# Patient Record
Sex: Female | Born: 1960
Health system: Southern US, Community
[De-identification: ages and names within clinical notes are randomized; demographics above are authoritative.]

## PROBLEM LIST (undated history)

## (undated) DIAGNOSIS — I639 Cerebral infarction, unspecified: Secondary | ICD-10-CM

## (undated) DIAGNOSIS — R4701 Aphasia: Secondary | ICD-10-CM

## (undated) DIAGNOSIS — N189 Chronic kidney disease, unspecified: Secondary | ICD-10-CM

## (undated) DIAGNOSIS — R55 Syncope and collapse: Secondary | ICD-10-CM

## (undated) DIAGNOSIS — J989 Respiratory disorder, unspecified: Secondary | ICD-10-CM

## (undated) DIAGNOSIS — I771 Stricture of artery: Secondary | ICD-10-CM

## (undated) DIAGNOSIS — I779 Disorder of arteries and arterioles, unspecified: Secondary | ICD-10-CM

## (undated) DIAGNOSIS — B004 Herpesviral encephalitis: Secondary | ICD-10-CM

## (undated) DIAGNOSIS — I129 Hypertensive chronic kidney disease with stage 1 through stage 4 chronic kidney disease, or unspecified chronic kidney disease: Secondary | ICD-10-CM

## (undated) DIAGNOSIS — I519 Heart disease, unspecified: Secondary | ICD-10-CM

## (undated) DIAGNOSIS — I2510 Atherosclerotic heart disease of native coronary artery without angina pectoris: Secondary | ICD-10-CM

## (undated) DIAGNOSIS — I82431 Acute embolism and thrombosis of right popliteal vein: Secondary | ICD-10-CM

## (undated) DIAGNOSIS — E785 Hyperlipidemia, unspecified: Secondary | ICD-10-CM

## (undated) DIAGNOSIS — N400 Enlarged prostate without lower urinary tract symptoms: Secondary | ICD-10-CM

## (undated) DIAGNOSIS — R29810 Facial weakness: Secondary | ICD-10-CM

## (undated) DIAGNOSIS — N8110 Cystocele, unspecified: Secondary | ICD-10-CM

## (undated) DIAGNOSIS — E038 Other specified hypothyroidism: Secondary | ICD-10-CM

## (undated) DIAGNOSIS — R7303 Prediabetes: Secondary | ICD-10-CM

## (undated) DIAGNOSIS — E7800 Pure hypercholesterolemia, unspecified: Secondary | ICD-10-CM

## (undated) DIAGNOSIS — R7301 Impaired fasting glucose: Secondary | ICD-10-CM

## (undated) DIAGNOSIS — B0230 Zoster ocular disease, unspecified: Secondary | ICD-10-CM

## (undated) DIAGNOSIS — M6259 Muscle wasting and atrophy, not elsewhere classified, multiple sites: Secondary | ICD-10-CM

## (undated) HISTORY — DX: Atherosclerotic heart disease of native coronary artery without angina pectoris: I25.10

## (undated) HISTORY — DX: Facial weakness: R29.810

## (undated) HISTORY — DX: Heart disease, unspecified: I51.9

## (undated) HISTORY — DX: Disorder of arteries and arterioles, unspecified: I77.9

## (undated) HISTORY — DX: Herpesviral encephalitis: B00.4

## (undated) HISTORY — DX: Syncope and collapse: R55

## (undated) HISTORY — DX: Acute embolism and thrombosis of right popliteal vein: I82.431

## (undated) HISTORY — PX: BREAST BIOPSY: SHX20

## (undated) HISTORY — DX: Hypertensive chronic kidney disease with stage 1 through stage 4 chronic kidney disease, or unspecified chronic kidney disease: I12.9

## (undated) HISTORY — DX: Pure hypercholesterolemia, unspecified: E78.00

## (undated) HISTORY — DX: Muscle wasting and atrophy, not elsewhere classified, multiple sites: M62.59

## (undated) HISTORY — DX: Zoster ocular disease, unspecified: B02.30

## (undated) HISTORY — DX: Cerebral infarction, unspecified: I63.9

## (undated) HISTORY — DX: Other specified hypothyroidism: E03.8

## (undated) HISTORY — DX: Hyperlipidemia, unspecified: E78.5

## (undated) HISTORY — DX: Impaired fasting glucose: R73.01

## (undated) HISTORY — DX: Prediabetes: R73.03

## (undated) HISTORY — DX: Stricture of artery: I77.1

## (undated) HISTORY — DX: Cystocele, unspecified: N81.10

## (undated) HISTORY — DX: Benign prostatic hyperplasia without lower urinary tract symptoms: N40.0

## (undated) HISTORY — DX: Chronic kidney disease, unspecified: N18.9

## (undated) HISTORY — DX: Respiratory disorder, unspecified: J98.9

## (undated) HISTORY — DX: Aphasia: R47.01

---

## 1997-11-26 ENCOUNTER — Ambulatory Visit (HOSPITAL_COMMUNITY): Admission: RE | Admit: 1997-11-26 | Discharge: 1997-11-26 | Payer: Self-pay | Admitting: *Deleted

## 1998-10-20 ENCOUNTER — Ambulatory Visit (HOSPITAL_COMMUNITY): Admission: RE | Admit: 1998-10-20 | Discharge: 1998-10-20 | Payer: Self-pay | Admitting: *Deleted

## 1998-12-11 ENCOUNTER — Other Ambulatory Visit: Admission: RE | Admit: 1998-12-11 | Discharge: 1998-12-11 | Payer: Self-pay | Admitting: Obstetrics and Gynecology

## 1999-09-10 ENCOUNTER — Other Ambulatory Visit: Admission: RE | Admit: 1999-09-10 | Discharge: 1999-09-10 | Payer: Self-pay | Admitting: Obstetrics and Gynecology

## 2000-09-20 ENCOUNTER — Other Ambulatory Visit: Admission: RE | Admit: 2000-09-20 | Discharge: 2000-09-20 | Payer: Self-pay | Admitting: Obstetrics and Gynecology

## 2001-11-27 ENCOUNTER — Other Ambulatory Visit: Admission: RE | Admit: 2001-11-27 | Discharge: 2001-11-27 | Payer: Self-pay | Admitting: Obstetrics and Gynecology

## 2003-01-10 ENCOUNTER — Other Ambulatory Visit: Admission: RE | Admit: 2003-01-10 | Discharge: 2003-01-10 | Payer: Self-pay | Admitting: Obstetrics and Gynecology

## 2004-03-24 ENCOUNTER — Other Ambulatory Visit: Admission: RE | Admit: 2004-03-24 | Discharge: 2004-03-24 | Payer: Self-pay | Admitting: Obstetrics and Gynecology

## 2004-06-01 ENCOUNTER — Ambulatory Visit: Payer: Self-pay | Admitting: Internal Medicine

## 2006-07-22 ENCOUNTER — Ambulatory Visit: Payer: Self-pay | Admitting: Cardiology

## 2008-02-19 ENCOUNTER — Encounter: Admission: RE | Admit: 2008-02-19 | Discharge: 2008-02-19 | Payer: Self-pay | Admitting: Obstetrics and Gynecology

## 2009-03-18 ENCOUNTER — Encounter: Admission: RE | Admit: 2009-03-18 | Discharge: 2009-03-18 | Payer: Self-pay | Admitting: Obstetrics and Gynecology

## 2009-11-11 ENCOUNTER — Encounter: Payer: Self-pay | Admitting: Internal Medicine

## 2009-12-08 ENCOUNTER — Ambulatory Visit: Payer: Self-pay | Admitting: Internal Medicine

## 2009-12-08 LAB — CONVERTED CEMR LAB
HCV Ab: NEGATIVE
Hep B S Ab: NEGATIVE
Hepatitis B Surface Ag: NEGATIVE

## 2009-12-09 LAB — CONVERTED CEMR LAB
ALT: 29 units/L (ref 0–35)
AST: 28 units/L (ref 0–37)
Albumin: 4.5 g/dL (ref 3.5–5.2)
Alkaline Phosphatase: 86 units/L (ref 39–117)
Bilirubin, Direct: 0.1 mg/dL (ref 0.0–0.3)
Total Bilirubin: 1.2 mg/dL (ref 0.3–1.2)
Total Protein: 7.2 g/dL (ref 6.0–8.3)

## 2009-12-15 ENCOUNTER — Telehealth: Payer: Self-pay | Admitting: Internal Medicine

## 2009-12-18 LAB — CONVERTED CEMR LAB: GGT: 83 units/L — ABNORMAL HIGH (ref 7–51)

## 2010-01-15 LAB — HM COLONOSCOPY

## 2010-02-22 ENCOUNTER — Encounter: Payer: Self-pay | Admitting: Cardiology

## 2010-03-03 ENCOUNTER — Other Ambulatory Visit: Payer: Self-pay | Admitting: Internal Medicine

## 2010-03-03 ENCOUNTER — Ambulatory Visit
Admission: RE | Admit: 2010-03-03 | Discharge: 2010-03-03 | Payer: Self-pay | Source: Home / Self Care | Attending: Internal Medicine | Admitting: Internal Medicine

## 2010-03-03 LAB — HEPATIC FUNCTION PANEL
ALT: 20 U/L (ref 0–35)
AST: 24 U/L (ref 0–37)
Albumin: 4.4 g/dL (ref 3.5–5.2)
Alkaline Phosphatase: 84 U/L (ref 39–117)
Bilirubin, Direct: 0.2 mg/dL (ref 0.0–0.3)
Total Bilirubin: 1.1 mg/dL (ref 0.3–1.2)
Total Protein: 7.2 g/dL (ref 6.0–8.3)

## 2010-03-03 LAB — LIPID PANEL
Cholesterol: 238 mg/dL — ABNORMAL HIGH (ref 0–200)
HDL: 65.4 mg/dL (ref >39.00)
Total CHOL/HDL Ratio: 4
Triglycerides: 90 mg/dL (ref 0.0–149.0)
VLDL: 18 mg/dL (ref 0.0–40.0)

## 2010-03-03 LAB — GAMMA GT: GGT: 73 U/L — ABNORMAL HIGH (ref 7–51)

## 2010-03-03 LAB — LDL CHOLESTEROL, DIRECT: Direct LDL: 159.7 mg/dL

## 2010-03-05 NOTE — Assessment & Plan Note (Signed)
Summary: BRAND NEW PT/TO EST/PT REQ CPX/PT COMING IN FASTING/PER DR SW...   Vital Signs:  Patient profile:   50 year old female Height:      64 inches Weight:      140.5 pounds BMI:     24.20 Temp:     98.0 degrees F oral Pulse rate:   64 / minute Pulse rhythm:   regular BP sitting:   116 / 72  (left arm) Cuff size:   regular  Vitals Entered By: Alfred Levins, CMA (December 08, 2009 8:15 AM) CC: establish, fasting   CC:  establish and fasting.  History of Present Illness: CPX  Preventive Screening-Counseling & Management  Alcohol-Tobacco     Smoking Status: never  Caffeine-Diet-Exercise     Does Patient Exercise: yes      Drug Use:  no.    Allergies (verified): No Known Drug Allergies  Past History:  Past Medical History: Unremarkable  Past Surgical History: Lt breast biopsy  Family History: Family History Breast cancer 1st degree relative <50--Mother (living at 58 yo) Family History of CAD Female 1st degree relative <50 CABG (age 79 yo) Family History Diabetes 1st degree relative (father/mother) Family History Hypertension--(father) father deceased--dementia  Social History: Married Never Smoked Alcohol use-yes Drug use-no Regular exercise-yes 3 kids all healthy Smoking Status:  never Drug Use:  no Does Patient Exercise:  yes  Physical Exam  General:  well-developed well-nourished female in no acute distress. HEENT exam atraumatic, normocephalic symmetric her muscles are intact. Neck is supple. Chest clear auscultation cardiac exam S1-S2 are regular. Abdominal exam active bowel sounds, soft nontender extremities without clubbing cyanosis or edema. Neurologic exam she alert and oriented. Gait is normal.   Impression & Recommendations:  Problem # 1:  HEALTH SCREENING (ICD-V70.0) health maintenance up-to-date. She is encouraged to continue excellent health habits. She exercises regularly. She needs colonoscopy. Orders: Gastroenterology Referral  (GI) Specimen Handling ([id number])  Problem # 2:  GAMMA GLUTAMYL TRANSFERASE, SERUM, ELEVATED (ICD-790.5) I suspect this is a spuriousl result on life insurance physical blood work. I'll repeat liver function tests. Will check hepatitis serologies. I doubt any further evaluation will be necessary. Orders: Venipuncture (14782) TLB-Hepatic/Liver Function Pnl (80076-HEPATIC) T-Hepatitis C Anti HCV (95621) T-Hepatitis B Surface Antigen (30865-78469) T-Hepatitis B Surface Antibody ([id number])    Orders Added: 1)  New Patient 40-64 years [99386] 2)  Venipuncture [44010] 3)  TLB-Hepatic/Liver Function Pnl [80076-HEPATIC] 4)  T-Hepatitis C Anti HCV [27253] 5)  T-Hepatitis B Surface Antigen [66440-34742] 6)  T-Hepatitis B Surface Antibody [59563-87564] 7)  Gastroenterology Referral [GI] 8)  Specimen Handling [99000]

## 2010-03-05 NOTE — Progress Notes (Signed)
Summary: labwork results  Phone Note Call from Patient Call back at Home Phone [phone]   Caller: Patient Call For: Birdie Sons MD Summary of Call: pt needs labwork results  Initial call taken by: Heron Sabins,  December 15, 2009 2:38 PM  Follow-up for Phone Call        Call patient. All other lab work is normal. The GGT is still elevated. I don't think anywork up needs to happen at this time. I would like to repeat in 3 months. 3 months labwork: LFTs, GGT Follow-up by: Birdie Sons MD,  December 17, 2009 11:33 AM  Additional Follow-up for Phone Call Additional follow up Details #1::        pt aware Additional Follow-up by: Alfred Levins, CMA,  December 18, 2009 9:23 AM

## 2010-04-09 ENCOUNTER — Telehealth: Payer: Self-pay | Admitting: Internal Medicine

## 2010-04-09 NOTE — Telephone Encounter (Signed)
Pt called and said that nurse was suppose to be mailing a copy of pts last lab results, to home address. Pt called to check on status. Pt said that if this can not be mailed, pt would like to pick up copy. Pls call.

## 2010-04-09 NOTE — Telephone Encounter (Signed)
Put labs in the mail today, pt aware

## 2010-06-16 NOTE — Assessment & Plan Note (Signed)
[facility]                            CARDIOLOGY OFFICE NOTE   EMRY, TOBIN                      MRN:          [id number]  DATE:07/22/2006                            DOB:          [birth year]    PRIMARY CARE PHYSICIAN:  None.   REASON FOR PRESENTATION:  Evaluate patient with chest discomfort,  shortness of breath, and family history of heart disease.   HISTORY OF PRESENT ILLNESS:  The patient is a pleasant 50 year old  without prior cardiac history.  She has had episodes of discomfort with  activity.  She noticed this this spring when she was in [place] skiing.  She had back discomfort and felt particularly short of breath.  She  recently noticed some increased fatigue.  This was earlier this week.  She had some mild chest fullness and back discomfort as well.  She had 1  episode of sharp discomfort this week.  These have been at rest.  She  does do some exercises, but not a lot of cardiovascular.  She has not  noticed any significant symptoms with her exercise regimen.  She has  been able to play tennis and do a little swimming by bringing on the  above symptoms.  The symptoms mentioned happened at rest.  She denies  any neck or arm discomfort.  She has had no new palpitations, though she  occasionally has some at night.  She has had no presyncope or syncope.  She denies any weight gain, lower extremity swelling, cough, fevers, or  chills.   PAST MEDICAL HISTORY:  She has no history of hypertension, diabetes, or  hyperlipidemia.   PAST SURGICAL HISTORY:  Breast biopsy.   ALLERGIES:  SCALLOPS.   MEDICATIONS:  None.   SOCIAL HISTORY:  The patient is an Pensions consultant.  She plays tennis.  She has  3 children.  She is married. She smoked briefly in college.   FAMILY HISTORY:  Remarkable for her brother having 6-vessel bypass this  year at age 76.  Her father probably had early disease, though they did  not know of any vascular problems until  he was in his 2s.  He died at  age 93 of a CVA.   REVIEW OF SYSTEMS:  As stated in the HPI, positive for questionable  reflux, cold intolerance, bleeding in her bowel movements with a  colonoscopy 5 years ago (no findings), negative for other systems.   PHYSICAL EXAMINATION:  The patient is well appearing and in no distress.  She is pleasant.  Blood pressure 118/68, heart rate 65 and regular.  HEENT:  Eyelids unremarkable, pupils are equal, round, and reactive to  light, fundi not visualized.  Oral mucosa unremarkable.  NECK:  No jugular venous distension at 45 degrees, carotid upstroke  brisk and symmetrical.  No bruit.  No thyromegaly.  LYMPHATICS:  No cervical, axillary, inguinal adenopathy.  LUNGS:  Clear to auscultation bilaterally.  BACK:  No costovertebral angle tenderness.  CHEST:  Unremarkable.  HEART:  PMI not displaced or sustained.  S1 and S2 are within normal  limits.  No S3, no  S4.  No clicks, no rubs, no murmurs.  ABDOMEN:  Flat, positive bowel sounds, normal in frequency and pitch.  No bruits, no rebound, no guarding.  No midline pulsatile mass.  No  hepatomegaly, no splenomegaly.  SKIN:  No rashes, no nodules.  EXTREMITIES:  2+ pulses throughout, no edema.  No cyanosis, no clubbing.  NEURO:  Oriented to person, place, and time.  Cranial nerves II-XII  grossly intact, motor grossly intact.   EKG:  Sinus rhythm, rate 65, axis within normal limits, intervals within  normal limits, no acute ST-wave changes.   ASSESSMENT AND PLAN:  1. Chest:  The patient has some chest discomfort, fatigue, and other      episodic symptoms.  She has a very strong family history for heart      disease.  I did review her lipid profile from 2006 and she was      noted to have a LDL of 150 with an HDL of 45.  Given this, there is      some possibility of obstructive coronary disease.  We discussed the      possibilities of stress testing versus imaging with a cardiac CT.      I think  cardiac CT is indicated in this situation.  This will allow      me to quantify any plaque.  This will guide our primary risk      reduction or any need for further intervention.  2. Dyslipidemia:  We will discuss further management in the context of      the CT results.  We did discuss dietary changes today.  3. Followup:  I will see her back as needed based on the results of      the above.  However, I am asking her to get another lipid profile      and we will be reviewing this with her.     Rollene Rotunda, MD, [facility]  Electronically Signed    JH/MedQ  DD: 07/22/2006  DT: 07/22/2006  Job #: [id number]

## 2010-07-09 ENCOUNTER — Telehealth: Payer: Self-pay | Admitting: Internal Medicine

## 2010-07-09 NOTE — Telephone Encounter (Signed)
Pt is needing to know if she has ever had a viral serology for hep a,b,c? Pt needs to know this per gi dr asap.

## 2010-07-13 NOTE — Telephone Encounter (Signed)
Pt p/u copy of labs

## 2010-07-14 ENCOUNTER — Other Ambulatory Visit (INDEPENDENT_AMBULATORY_CARE_PROVIDER_SITE_OTHER): Payer: BC Managed Care – PPO

## 2010-07-14 DIAGNOSIS — K769 Liver disease, unspecified: Secondary | ICD-10-CM

## 2010-07-14 DIAGNOSIS — R945 Abnormal results of liver function studies: Secondary | ICD-10-CM

## 2010-07-15 ENCOUNTER — Encounter: Payer: Self-pay | Admitting: Internal Medicine

## 2010-07-15 LAB — ANA: Anti Nuclear Antibody(ANA): NEGATIVE

## 2010-07-15 LAB — ANTI-SMOOTH MUSCLE ANTIBODY, IGG: Smooth Muscle Ab: 5 U (ref <20)

## 2010-07-16 ENCOUNTER — Telehealth: Payer: Self-pay | Admitting: *Deleted

## 2010-07-16 NOTE — Telephone Encounter (Signed)
Pt wants lab results 

## 2010-07-17 NOTE — Telephone Encounter (Signed)
Pt aware of results 

## 2010-07-21 ENCOUNTER — Ambulatory Visit: Payer: Self-pay | Admitting: Internal Medicine

## 2010-07-23 ENCOUNTER — Ambulatory Visit (INDEPENDENT_AMBULATORY_CARE_PROVIDER_SITE_OTHER): Payer: BC Managed Care – PPO | Admitting: Internal Medicine

## 2010-07-23 ENCOUNTER — Encounter: Payer: Self-pay | Admitting: Internal Medicine

## 2010-07-23 VITALS — BP 102/70 | HR 68 | Temp 98.3°F | Ht 65.0 in | Wt 140.0 lb

## 2010-07-23 DIAGNOSIS — R748 Abnormal levels of other serum enzymes: Secondary | ICD-10-CM

## 2010-07-23 LAB — CBC WITH DIFFERENTIAL/PLATELET
Basophils Absolute: 0 10*3/uL (ref 0.0–0.1)
Basophils Relative: 0.5 % (ref 0.0–3.0)
Eosinophils Absolute: 0.1 10*3/uL (ref 0.0–0.7)
Eosinophils Relative: 1.3 % (ref 0.0–5.0)
HCT: 37.7 % (ref 36.0–46.0)
Hemoglobin: 13 g/dL (ref 12.0–15.0)
Lymphocytes Relative: 38.8 % (ref 12.0–46.0)
Lymphs Abs: 2.2 10*3/uL (ref 0.7–4.0)
MCHC: 34.4 g/dL (ref 30.0–36.0)
MCV: 91.4 fl (ref 78.0–100.0)
Monocytes Absolute: 0.5 10*3/uL (ref 0.1–1.0)
Monocytes Relative: 8.5 % (ref 3.0–12.0)
Neutro Abs: 2.9 10*3/uL (ref 1.4–7.7)
Neutrophils Relative %: 50.9 % (ref 43.0–77.0)
Platelets: 228 10*3/uL (ref 150.0–400.0)
RBC: 4.13 Mil/uL (ref 3.87–5.11)
RDW: 13 % (ref 11.5–14.6)
WBC: 5.8 10*3/uL (ref 4.5–10.5)

## 2010-07-23 LAB — HEPATIC FUNCTION PANEL
ALT: 24 U/L (ref 0–35)
AST: 25 U/L (ref 0–37)
Albumin: 4.7 g/dL (ref 3.5–5.2)
Alkaline Phosphatase: 89 U/L (ref 39–117)
Bilirubin, Direct: 0.1 mg/dL (ref 0.0–0.3)
Total Bilirubin: 1.2 mg/dL (ref 0.3–1.2)
Total Protein: 7.2 g/dL (ref 6.0–8.3)

## 2010-07-23 LAB — GAMMA GT: GGT: 66 U/L — ABNORMAL HIGH (ref 7–51)

## 2010-07-23 NOTE — Progress Notes (Signed)
  Subjective:    Patient ID: Emily Joyce, female    DOB: 1961/01/01, 50 y.o.   MRN: [id number]  HPI  Chronic GGT elevation [facility] - GGT, AST and ALT elevated--- AST in the mid 200 range (her report) No past medical history on file. Past Surgical History  Procedure Date  . Breast biopsy     left    reports that she has never smoked. She does not have any smokeless tobacco history on file. She reports that she drinks alcohol. She reports that she does not use illicit drugs. family history includes Cancer in her mother; Dementia in her father; Diabetes in her father and mother; Heart disease in her father; and Hypertension in her father. No Known Allergies   Review of Systems  patient denies chest pain, shortness of breath, orthopnea. Denies lower extremity edema, abdominal pain, change in appetite, change in bowel movements. Patient denies rashes, musculoskeletal complaints. No other specific complaints in a complete review of systems.      Objective:   Physical Exam  Well-developed well-nourished female in no acute distress. HEENT exam atraumatic, normocephalic, extraocular muscles are intact. Neck is supple. No jugular venous distention no thyromegaly. Chest clear to auscultation without increased work of breathing. Cardiac exam S1 and S2 are regular. Abdominal exam active bowel sounds, soft, nontender. Extremities no edema. Neurologic exam she is alert without any motor sensory deficits. Gait is normal.        Assessment & Plan:

## 2010-07-27 ENCOUNTER — Telehealth: Payer: Self-pay | Admitting: *Deleted

## 2010-07-27 NOTE — Telephone Encounter (Addendum)
Please call with lab results, please.  May leave message.

## 2010-07-27 NOTE — Telephone Encounter (Signed)
Gave pt lab results and numbers, left copy up front for pt to p/u

## 2010-07-28 NOTE — Assessment & Plan Note (Signed)
I have reviewed notes an outside laboratories from [facility] gastroenterology. Note elevated transaminases. I did discuss with her the possibility that this is exercise related. She did exercise vigorously one day prior to most recent laboratory work. I think it's best to repeat the laboratory work and then make a treatment plan for that. His serum transaminases remain elevated the possibility of a liver biopsy may need to be considered.

## 2012-09-26 ENCOUNTER — Encounter: Payer: Self-pay | Admitting: Family Medicine

## 2012-09-26 ENCOUNTER — Ambulatory Visit (INDEPENDENT_AMBULATORY_CARE_PROVIDER_SITE_OTHER): Payer: BC Managed Care – PPO | Admitting: Family Medicine

## 2012-09-26 VITALS — BP 102/68 | Temp 98.3°F | Wt 145.0 lb

## 2012-09-26 DIAGNOSIS — B029 Zoster without complications: Secondary | ICD-10-CM

## 2012-09-26 DIAGNOSIS — R21 Rash and other nonspecific skin eruption: Secondary | ICD-10-CM

## 2012-09-26 MED ORDER — VALACYCLOVIR HCL 1 G PO TABS: 1000.0000 mg | ORAL_TABLET | Freq: Three times a day (TID) | ORAL | Status: DC

## 2012-09-26 NOTE — Progress Notes (Signed)
Chief Complaint  Patient presents with  . painful rash    HPI:  Emily Joyce is a 52 yo F pt of Dr. Cato Mulligan here for an acute visit for a skin rash: -started about 4 days ago on back on L side -burning and painful pain - felt like had pain here before rash developed, had some blisters -denies: fevers, NVD, malaise -has had chicken pox  ROS: See pertinent positives and negatives per HPI.  No past medical history on file.  Past Surgical History  Procedure Laterality Date  . Breast biopsy      left    Family History  Problem Relation Age of Onset  . Cancer Mother     cancer  . Diabetes Mother   . Heart disease Father     CABG  . Diabetes Father   . Hypertension Father   . Dementia Father     History   Social History  . Marital Status: Married    Spouse Name: N/A    Number of Children: N/A  . Years of Education: N/A   Social History Main Topics  . Smoking status: Never Smoker   . Smokeless tobacco: None  . Alcohol Use: Yes  . Drug Use: No  . Sexual Activity:    Other Topics Concern  . None   Social History Narrative  . None    Current outpatient prescriptions:valACYclovir (VALTREX) 1000 MG tablet, Take 1 tablet (1,000 mg total) by mouth 3 (three) times daily., Disp: 21 tablet, Rfl: 0  EXAM:  Filed Vitals:   09/26/12 0856  BP: 102/68  Temp: 98.3 F (36.8 C)    Body mass index is 24.13 kg/(m^2).  GENERAL: vitals reviewed and listed above, alert, oriented, appears well hydrated and in no acute distress  HEENT: atraumatic, conjunttiva clear, no obvious abnormalities on inspection of external nose and ears  NECK: no obvious masses on inspection  MS: moves all extremities without noticeable abnormality  SKIN: two patches of erythematous vesicular lesions on L lower back  PSYCH: pleasant and cooperative, no obvious depression or anxiety  ASSESSMENT AND PLAN:  Discussed the following assessment and plan:  Shingles - Plan: valACYclovir  (VALTREX) 1000 MG tablet  Rash and nonspecific skin eruption  -given discrete and localized area of rash it is difficult to say for certain if this is shingles or an insect bite. Discussed options. Pt worried about shingles and would like to do antiviral medication. Risks discussed. Return precautions discussed. -Patient advised to return or notify a doctor immediately if symptoms worsen or persist or new concerns arise.  Patient Instructions  Shingles Shingles (herpes zoster) is an infection that is caused by the same virus that causes chickenpox (varicella). The infection causes a painful skin rash and fluid-filled blisters, which eventually break open, crust over, and heal. It may occur in any area of the body, but it usually affects only one side of the body or face. The pain of shingles usually lasts about 1 month. However, some people with shingles may develop long-term (chronic) pain in the affected area of the body. Shingles often occurs many years after the person had chickenpox. It is more common:  In people older than 50 years.  In people with weakened immune systems, such as those with HIV, AIDS, or cancer.  In people taking medicines that weaken the immune system, such as transplant medicines.  In people under great stress. CAUSES  Shingles is caused by the varicella zoster virus (VZV), which also  causes chickenpox. After a person is infected with the virus, it can remain in the person's body for years in an inactive state (dormant). To cause shingles, the virus reactivates and breaks out as an infection in a nerve root. The virus can be spread from person to person (contagious) through contact with open blisters of the shingles rash. It will only spread to people who have not had chickenpox. When these people are exposed to the virus, they may develop chickenpox. They will not develop shingles. Once the blisters scab over, the person is no longer contagious and cannot spread the  virus to others. SYMPTOMS  Shingles shows up in stages. The initial symptoms may be pain, itching, and tingling in an area of the skin. This pain is usually described as burning, stabbing, or throbbing.In a few days or weeks, a painful red rash will appear in the area where the pain, itching, and tingling were felt. The rash is usually on one side of the body in a band or belt-like pattern. Then, the rash usually turns into fluid-filled blisters. They will scab over and dry up in approximately 2 3 weeks. Flu-like symptoms may also occur with the initial symptoms, the rash, or the blisters. These may include:  Fever.  Chills.  Headache.  Upset stomach. DIAGNOSIS  Your caregiver will perform a skin exam to diagnose shingles. Skin scrapings or fluid samples may also be taken from the blisters. This sample will be examined under a microscope or sent to a lab for further testing. TREATMENT  There is no specific cure for shingles. Your caregiver will likely prescribe medicines to help you manage the pain, recover faster, and avoid long-term problems. This may include antiviral drugs, anti-inflammatory drugs, and pain medicines. HOME CARE INSTRUCTIONS   Take a cool bath or apply cool compresses to the area of the rash or blisters as directed. This may help with the pain and itching.   Only take over-the-counter or prescription medicines as directed by your caregiver.   Rest as directed by your caregiver.  Keep your rash and blisters clean with mild soap and cool water or as directed by your caregiver.  Do not pick your blisters or scratch your rash. Apply an anti-itch cream or numbing creams to the affected area as directed by your caregiver.  Keep your shingles rash covered with a loose bandage (dressing).  Avoid skin contact with:  Babies.   Pregnant women.   Children with eczema.   Elderly people with transplants.   People with chronic illnesses, such as leukemia or AIDS.    Wear loose-fitting clothing to help ease the pain of material rubbing against the rash.  Keep all follow-up appointments with your caregiver.If the area involved is on your face, you may receive a referral for follow-up to a specialist, such as an eye doctor (ophthalmologist) or an ear, nose, and throat (ENT) doctor. Keeping all follow-up appointments will help you avoid eye complications, chronic pain, or disability.  SEEK IMMEDIATE MEDICAL CARE IF:   You have facial pain, pain around the eye area, or loss of feeling on one side of your face.  You have ear pain or ringing in your ear.  You have loss of taste.  Your pain is not relieved with prescribed medicines.   Your redness or swelling spreads.   You have more pain and swelling.  Your condition is worsening or has changed.   You have a feveror persistent symptoms for more than 2 3 days.  You have a fever and your symptoms suddenly get worse. MAKE SURE YOU:  Understand these instructions.  Will watch your condition.  Will get help right away if you are not doing well or get worse. Document Released: 01/18/2005 Document Revised: 10/13/2011 Document Reviewed: 09/02/2011 [facility] Patient Information 2014 [place], Lona Kettle, Dahlia Client R.

## 2012-09-26 NOTE — Patient Instructions (Signed)
Shingles Shingles (herpes zoster) is an infection that is caused by the same virus that causes chickenpox (varicella). The infection causes a painful skin rash and fluid-filled blisters, which eventually break open, crust over, and heal. It may occur in any area of the body, but it usually affects only one side of the body or face. The pain of shingles usually lasts about 1 month. However, some people with shingles may develop long-term (chronic) pain in the affected area of the body. Shingles often occurs many years after the person had chickenpox. It is more common:  In people older than 50 years.  In people with weakened immune systems, such as those with HIV, AIDS, or cancer.  In people taking medicines that weaken the immune system, such as transplant medicines.  In people under great stress. CAUSES  Shingles is caused by the varicella zoster virus (VZV), which also causes chickenpox. After a person is infected with the virus, it can remain in the person's body for years in an inactive state (dormant). To cause shingles, the virus reactivates and breaks out as an infection in a nerve root. The virus can be spread from person to person (contagious) through contact with open blisters of the shingles rash. It will only spread to people who have not had chickenpox. When these people are exposed to the virus, they may develop chickenpox. They will not develop shingles. Once the blisters scab over, the person is no longer contagious and cannot spread the virus to others. SYMPTOMS  Shingles shows up in stages. The initial symptoms may be pain, itching, and tingling in an area of the skin. This pain is usually described as burning, stabbing, or throbbing.In a few days or weeks, a painful red rash will appear in the area where the pain, itching, and tingling were felt. The rash is usually on one side of the body in a band or belt-like pattern. Then, the rash usually turns into fluid-filled blisters. They  will scab over and dry up in approximately 2 3 weeks. Flu-like symptoms may also occur with the initial symptoms, the rash, or the blisters. These may include:  Fever.  Chills.  Headache.  Upset stomach. DIAGNOSIS  Your caregiver will perform a skin exam to diagnose shingles. Skin scrapings or fluid samples may also be taken from the blisters. This sample will be examined under a microscope or sent to a lab for further testing. TREATMENT  There is no specific cure for shingles. Your caregiver will likely prescribe medicines to help you manage the pain, recover faster, and avoid long-term problems. This may include antiviral drugs, anti-inflammatory drugs, and pain medicines. HOME CARE INSTRUCTIONS   Take a cool bath or apply cool compresses to the area of the rash or blisters as directed. This may help with the pain and itching.   Only take over-the-counter or prescription medicines as directed by your caregiver.   Rest as directed by your caregiver.  Keep your rash and blisters clean with mild soap and cool water or as directed by your caregiver.  Do not pick your blisters or scratch your rash. Apply an anti-itch cream or numbing creams to the affected area as directed by your caregiver.  Keep your shingles rash covered with a loose bandage (dressing).  Avoid skin contact with:  Babies.   Pregnant women.   Children with eczema.   Elderly people with transplants.   People with chronic illnesses, such as leukemia or AIDS.   Wear loose-fitting clothing to help ease   the pain of material rubbing against the rash.  Keep all follow-up appointments with your caregiver.If the area involved is on your face, you may receive a referral for follow-up to a specialist, such as an eye doctor (ophthalmologist) or an ear, nose, and throat (ENT) doctor. Keeping all follow-up appointments will help you avoid eye complications, chronic pain, or disability.  SEEK IMMEDIATE MEDICAL  CARE IF:   You have facial pain, pain around the eye area, or loss of feeling on one side of your face.  You have ear pain or ringing in your ear.  You have loss of taste.  Your pain is not relieved with prescribed medicines.   Your redness or swelling spreads.   You have more pain and swelling.  Your condition is worsening or has changed.   You have a feveror persistent symptoms for more than 2 3 days.  You have a fever and your symptoms suddenly get worse. MAKE SURE YOU:  Understand these instructions.  Will watch your condition.  Will get help right away if you are not doing well or get worse. Document Released: 01/18/2005 Document Revised: 10/13/2011 Document Reviewed: 09/02/2011 [employer] Patient Information 2014 [employer]  

## 2013-02-01 DIAGNOSIS — B029 Zoster without complications: Secondary | ICD-10-CM

## 2013-02-01 HISTORY — DX: Zoster without complications: B02.9

## 2013-06-14 ENCOUNTER — Ambulatory Visit (INDEPENDENT_AMBULATORY_CARE_PROVIDER_SITE_OTHER): Payer: BC Managed Care – PPO | Admitting: Family Medicine

## 2013-06-14 ENCOUNTER — Encounter: Payer: Self-pay | Admitting: Family Medicine

## 2013-06-14 VITALS — BP 100/64 | HR 86 | Temp 97.9°F | Ht 65.0 in | Wt 147.0 lb

## 2013-06-14 DIAGNOSIS — W57XXXA Bitten or stung by nonvenomous insect and other nonvenomous arthropods, initial encounter: Secondary | ICD-10-CM

## 2013-06-14 DIAGNOSIS — T148 Other injury of unspecified body region: Secondary | ICD-10-CM

## 2013-06-14 DIAGNOSIS — Z23 Encounter for immunization: Secondary | ICD-10-CM

## 2013-06-14 MED ORDER — TETANUS-DIPHTH-ACELL PERTUSSIS 5-2.5-18.5 LF-MCG/0.5 IM SUSP: 0.5000 mL | Freq: Once | INTRAMUSCULAR | Status: DC

## 2013-06-14 MED ORDER — DOXYCYCLINE HYCLATE 100 MG PO TABS: 100.0000 mg | ORAL_TABLET | Freq: Two times a day (BID) | ORAL | Status: DC

## 2013-06-14 NOTE — Patient Instructions (Signed)
Tick Bite Information Ticks are insects that attach themselves to the skin and draw blood for food. There are various types of ticks. Common types include wood ticks and deer ticks. Most ticks live in shrubs and grassy areas. Ticks can climb onto your body when you make contact with leaves or grass where the tick is waiting. The most common places on the body for ticks to attach themselves are the scalp, neck, armpits, waist, and groin. Most tick bites are harmless, but sometimes ticks carry germs that cause diseases. These germs can be spread to a person during the tick's feeding process. The chance of a disease spreading through a tick bite depends on:   The type of tick.  Time of year.   How long the tick is attached.   Geographic location.  HOW CAN YOU PREVENT TICK BITES? Take these steps to help prevent tick bites when you are outdoors:  Wear protective clothing. Long sleeves and long pants are best.   Wear white clothes so you can see ticks more easily.  Tuck your pant legs into your socks.   If walking on a trail, stay in the middle of the trail to avoid brushing against bushes.  Avoid walking through areas with long grass.  Put insect repellent on all exposed skin and along boot tops, pant legs, and sleeve cuffs.   Check clothing, hair, and skin repeatedly and before going inside.   Brush off any ticks that are not attached.  Take a shower or bath as soon as possible after being outdoors.  WHAT IS THE PROPER WAY TO REMOVE A TICK? Ticks should be removed as soon as possible to help prevent diseases caused by tick bites. 1. If latex gloves are available, put them on before trying to remove a tick.  2. Using fine-point tweezers, grasp the tick as close to the skin as possible. You may also use curved forceps or a tick removal tool. Grasp the tick as close to its head as possible. Avoid grasping the tick on its body. 3. Pull gently with steady upward pressure until  the tick lets go. Do not twist the tick or jerk it suddenly. This may break off the tick's head or mouth parts. 4. Do not squeeze or crush the tick's body. This could force disease-carrying fluids from the tick into your body.  5. After the tick is removed, wash the bite area and your hands with soap and water or other disinfectant such as alcohol. 6. Apply a small amount of antiseptic cream or ointment to the bite site.  7. Wash and disinfect any instruments that were used.  Do not try to remove a tick by applying a hot match, petroleum jelly, or fingernail polish to the tick. These methods do not work and may increase the chances of disease being spread from the tick bite.  WHEN SHOULD YOU SEEK MEDICAL CARE? Contact your health care provider if you are unable to remove a tick from your skin or if a part of the tick breaks off and is stuck in the skin.  After a tick bite, you need to be aware of signs and symptoms that could be related to diseases spread by ticks. Contact your health care provider if you develop any of the following in the days or weeks after the tick bite:  Unexplained fever.  Rash. A circular rash that appears days or weeks after the tick bite may indicate the possibility of Lyme disease. The rash may resemble   a target with a bull's-eye and may occur at a different part of your body than the tick bite.  Redness and swelling in the area of the tick bite.   Tender, swollen lymph glands.   Diarrhea.   Weight loss.   Cough.   Fatigue.   Muscle, joint, or bone pain.   Abdominal pain.   Headache.   Lethargy or a change in your level of consciousness.  Difficulty walking or moving your legs.   Numbness in the legs.   Paralysis.  Shortness of breath.   Confusion.   Repeated vomiting.  Document Released: 01/16/2000 Document Revised: 11/08/2012 Document Reviewed: 06/28/2012 [employer] Patient Information 2014 [employer].  

## 2013-06-14 NOTE — Progress Notes (Signed)
No chief complaint on file.   HPI:  Tick Bite: -reports: bit by tick about 1 week ago and doesn't think was biting long and removed, stari tick per distruction -denies: fever, malaise, rash, vomiting, HA -can't remember last tdap  ROS: See pertinent positives and negatives per HPI.  No past medical history on file.  Past Surgical History  Procedure Laterality Date  . Breast biopsy      left    Family History  Problem Relation Age of Onset  . Cancer Mother     cancer  . Diabetes Mother   . Heart disease Father     CABG  . Diabetes Father   . Hypertension Father   . Dementia Father     History   Social History  . Marital Status: Married    Spouse Name: N/A    Number of Children: N/A  . Years of Education: N/A   Social History Main Topics  . Smoking status: Never Smoker   . Smokeless tobacco: None  . Alcohol Use: Yes  . Drug Use: No  . Sexual Activity:    Other Topics Concern  . None   Social History Narrative  . None    Current outpatient prescriptions:doxycycline (VIBRA-TABS) 100 MG tablet, Take 1 tablet (100 mg total) by mouth 2 (two) times daily., Disp: 20 tablet, Rfl: 0  EXAM:  Filed Vitals:   06/14/13 1408  BP: 100/64  Pulse: 86  Temp: 97.9 F (36.6 C)    Body mass index is 24.46 kg/(m^2).  GENERAL: vitals reviewed and listed above, alert, oriented, appears well hydrated and in no acute distress  HEENT: atraumatic, conjunttiva clear, no obvious abnormalities on inspection of external nose and ears  NECK: no obvious masses on inspection  LUNGS: clear to auscultation bilaterally, no wheezes, rales or rhonchi, good air movement  SKIN: ? Target lesion versus mild cellulitis upper back  PSYCH: pleasant and cooperative, no obvious depression or anxiety  ASSESSMENT AND PLAN:  Discussed the following assessment and plan:  Tick bite - Plan: doxycycline (VIBRA-TABS) 100 MG tablet  -tx with doxy to cover tick borne illness/mild cellulitis  though most likely STARI and no systemic symptoms -tdap booster -return and emergent precautions, discussion of signs and symptoms of tick borne illnes -Patient advised to return or notify a doctor immediately if symptoms worsen or persist or new concerns arise.  Patient Instructions  Tick Bite Information Ticks are insects that attach themselves to the skin and draw blood for food. There are various types of ticks. Common types include wood ticks and deer ticks. Most ticks live in shrubs and grassy areas. Ticks can climb onto your body when you make contact with leaves or grass where the tick is waiting. The most common places on the body for ticks to attach themselves are the scalp, neck, armpits, waist, and groin. Most tick bites are harmless, but sometimes ticks carry germs that cause diseases. These germs can be spread to a person during the tick's feeding process. The chance of a disease spreading through a tick bite depends on:   The type of tick.  Time of year.   How long the tick is attached.   Geographic location.  HOW CAN YOU PREVENT TICK BITES? Take these steps to help prevent tick bites when you are outdoors:  Wear protective clothing. Long sleeves and long pants are best.   Wear white clothes so you can see ticks more easily.  Tuck your pant legs into your  socks.   If walking on a trail, stay in the middle of the trail to avoid brushing against bushes.  Avoid walking through areas with long grass.  Put insect repellent on all exposed skin and along boot tops, pant legs, and sleeve cuffs.   Check clothing, hair, and skin repeatedly and before going inside.   Brush off any ticks that are not attached.  Take a shower or bath as soon as possible after being outdoors.  WHAT IS THE PROPER WAY TO REMOVE A TICK? Ticks should be removed as soon as possible to help prevent diseases caused by tick bites. 1. If latex gloves are available, put them on before trying  to remove a tick.  2. Using fine-point tweezers, grasp the tick as close to the skin as possible. You may also use curved forceps or a tick removal tool. Grasp the tick as close to its head as possible. Avoid grasping the tick on its body. 3. Pull gently with steady upward pressure until the tick lets go. Do not twist the tick or jerk it suddenly. This may break off the tick's head or mouth parts. 4. Do not squeeze or crush the tick's body. This could force disease-carrying fluids from the tick into your body.  5. After the tick is removed, wash the bite area and your hands with soap and water or other disinfectant such as alcohol. 6. Apply a small amount of antiseptic cream or ointment to the bite site.  7. Wash and disinfect any instruments that were used.  Do not try to remove a tick by applying a hot match, petroleum jelly, or fingernail polish to the tick. These methods do not work and may increase the chances of disease being spread from the tick bite.  WHEN SHOULD YOU SEEK MEDICAL CARE? Contact your health care provider if you are unable to remove a tick from your skin or if a part of the tick breaks off and is stuck in the skin.  After a tick bite, you need to be aware of signs and symptoms that could be related to diseases spread by ticks. Contact your health care provider if you develop any of the following in the days or weeks after the tick bite:  Unexplained fever.  Rash. A circular rash that appears days or weeks after the tick bite may indicate the possibility of Lyme disease. The rash may resemble a target with a bull's-eye and may occur at a different part of your body than the tick bite.  Redness and swelling in the area of the tick bite.   Tender, swollen lymph glands.   Diarrhea.   Weight loss.   Cough.   Fatigue.   Muscle, joint, or bone pain.   Abdominal pain.   Headache.   Lethargy or a change in your level of consciousness.  Difficulty walking  or moving your legs.   Numbness in the legs.   Paralysis.  Shortness of breath.   Confusion.   Repeated vomiting.  Document Released: 01/16/2000 Document Revised: 11/08/2012 Document Reviewed: 06/28/2012 [facility] Patient Information 2014 [employer], [place].      Terressa KoyanagiHannah R. Kim

## 2013-06-14 NOTE — Progress Notes (Signed)
Pre visit review using our clinic review tool, if applicable. No additional management support is needed unless otherwise documented below in the visit note. 

## 2013-06-14 NOTE — Addendum Note (Signed)
Addended by: Johnella MoloneyFUNDERBURK, Cara Aguino A on: 06/14/2013 02:45 PM   Modules accepted: Orders

## 2013-07-31 ENCOUNTER — Ambulatory Visit (INDEPENDENT_AMBULATORY_CARE_PROVIDER_SITE_OTHER): Payer: BC Managed Care – PPO | Admitting: Family Medicine

## 2013-07-31 ENCOUNTER — Encounter: Payer: Self-pay | Admitting: Family Medicine

## 2013-07-31 VITALS — BP 104/70 | Temp 97.9°F | Wt 145.8 lb

## 2013-07-31 DIAGNOSIS — J029 Acute pharyngitis, unspecified: Secondary | ICD-10-CM

## 2013-07-31 NOTE — Progress Notes (Signed)
Pre visit review using our clinic review tool, if applicable. No additional management support is needed unless otherwise documented below in the visit note. 

## 2013-07-31 NOTE — Progress Notes (Signed)
   Subjective:    Patient ID: Emily Joyce, female    DOB: 1960-05-19, 53 y.o.   MRN: [id number]  HPI Alexia Freestoneatty is a 53 year old married female nonsmoker who comes in with a 2 week history of a right-sided sore throat  States she's had a sore throat for the past 2 weeks it seems to be getting a little better but not gone away. It's only on the right side. She has no fever earache sore throat or cough. She's not been on any sick children.   Review of Systems Review of systems otherwise negative    Objective:   Physical Exam Well-developed well-nourished female no acute distress vital signs stable she is afebrile...........Marland Kitchen. examination of the oral cavity teeth and gums, all normal. Left tonsil normal. Right tonsil is cryptic with pockets and food particle stuck in the crypts       Assessment & Plan:  Pharyngitis secondary to cryptic tonsils..........Marland Kitchen. gargle 3-4 times daily

## 2013-07-31 NOTE — Patient Instructions (Signed)
Gargle with warm salt water 3-4 times daily return when necessary

## 2013-08-29 ENCOUNTER — Other Ambulatory Visit: Payer: Self-pay | Admitting: Obstetrics and Gynecology

## 2013-08-31 LAB — CYTOLOGY - PAP

## 2013-10-09 ENCOUNTER — Telehealth: Payer: Self-pay | Admitting: Internal Medicine

## 2013-10-09 NOTE — Telephone Encounter (Signed)
Per Dr Selena Batten she will see the pt.

## 2013-10-09 NOTE — Telephone Encounter (Signed)
Pt is a prev [facility] patient and would like to know if Dr Selena Batten would except her as a patient.

## 2013-10-10 NOTE — Telephone Encounter (Signed)
Pt has been notified of decision

## 2013-10-11 ENCOUNTER — Ambulatory Visit: Payer: BC Managed Care – PPO | Admitting: Family Medicine

## 2013-10-23 ENCOUNTER — Encounter: Payer: Self-pay | Admitting: Family Medicine

## 2013-10-23 ENCOUNTER — Ambulatory Visit (INDEPENDENT_AMBULATORY_CARE_PROVIDER_SITE_OTHER): Payer: BC Managed Care – PPO | Admitting: Family Medicine

## 2013-10-23 VITALS — BP 100/78 | HR 63 | Temp 97.8°F | Ht 65.0 in | Wt 145.5 lb

## 2013-10-23 DIAGNOSIS — E785 Hyperlipidemia, unspecified: Secondary | ICD-10-CM

## 2013-10-23 DIAGNOSIS — R946 Abnormal results of thyroid function studies: Secondary | ICD-10-CM

## 2013-10-23 DIAGNOSIS — Z7689 Persons encountering health services in other specified circumstances: Secondary | ICD-10-CM

## 2013-10-23 LAB — T4, FREE: Free T4: 0.71 ng/dL (ref 0.60–1.60)

## 2013-10-23 LAB — TSH: TSH: 0.74 u[IU]/mL (ref 0.35–4.50)

## 2013-10-23 NOTE — Progress Notes (Signed)
No chief complaint on file.   HPI:  Emily Joyce is here to establish care.  Last PCP and physical: Sees Dr. Dareen Piano at [facility] for annual exams - had all of her labs in July. LDL cholesterol was elevated and the free t4 mildly low but TSH was normal.  Has the following chronic problems and concerns today:  Patient Active Problem List   Diagnosis Date Noted  . Borderline abnormal TFTs 10/23/2013  . Hyperlipemia 10/23/2013   Abnormal TFTs: -on screening with gyn -denies: heat/cold intolerance, skin changes, palpitations  HLD: -with labs done by gyn -diet is ok, no regular exercise -never on medication  L leg pain:  -a few weeks ago with certain positions, a little mild numbness that resolved -now resolved  ROS negative for unless reported above: fevers, unintentional weight loss, hearing or vision loss, chest pain, palpitations, struggling to breath, hemoptysis, melena, hematochezia, hematuria, falls, loc, si, thoughts of self harm  Past Medical History  Diagnosis Date  . Hyperlipidemia     Family History  Problem Relation Age of Onset  . Cancer Mother     cancer  . Diabetes Mother   . Heart disease Father     CABG  . Diabetes Father   . Hypertension Father   . Dementia Father     History   Social History  . Marital Status: Married    Spouse Name: N/A    Number of Children: N/A  . Years of Education: N/A   Social History Main Topics  . Smoking status: Never Smoker   . Smokeless tobacco: None  . Alcohol Use: Yes  . Drug Use: No  . Sexual Activity:    Other Topics Concern  . 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  EXAM:  Filed Vitals:   10/23/13 1442  BP: 100/78  Pulse: 63  Temp: 97.8 F (36.6 C)    Body mass  index is 24.21 kg/(m^2).  GENERAL: vitals reviewed and listed above, alert, oriented, appears well hydrated and in no acute distress  HEENT: atraumatic, conjunttiva clear, no obvious abnormalities on inspection of external nose and ears  NECK: no obvious masses on inspection  LUNGS: clear to auscultation bilaterally, no wheezes, rales or rhonchi, good air movement  CV: HRRR, no peripheral edema  MS: moves all extremities without noticeable abnormality  PSYCH: pleasant and cooperative, no obvious depression or anxiety  ASSESSMENT AND PLAN:  Discussed the following assessment and plan:  Encounter to establish care  Borderline abnormal TFTs - Plan: TSH, T4, Free, T3 -repeat testing  Hyperlipemia -opted for diet and exercise -recheck in 4 months  -We reviewed the PMH, PSH, FH, SH, Meds and Allergies. -We provided refills for any medications we will prescribe as needed. -We addressed current concerns per orders and patient instructions. -We have asked for records for pertinent exams, studies, vaccines and notes from previous providers. -We have advised patient to follow up per instructions below.   -Patient advised to return or notify a doctor immediately if symptoms worsen or persist or new concerns arise.  Patient Instructions  -We have ordered labs or studies at this visit. It can take up to  1-2 weeks for results and processing. We will contact you with instructions IF your results are abnormal. Normal results will be released to your [facility]. If you have not heard from [place] or can not find your results in [facility] in 2 weeks please contact our office.  We recommend the following healthy lifestyle measures: - eat a healthy diet consisting of lots of vegetables, fruits, beans, nuts, seeds, healthy meats such as white chicken and fish and whole grains.  - avoid fried foods, fast food, processed foods, sodas, red meet and other fattening foods.  - get a least 150 minutes of  aerobic exercise per week.            Kriste Basque R.

## 2013-10-23 NOTE — Progress Notes (Signed)
Pre visit review using our clinic review tool, if applicable. No additional management support is needed unless otherwise documented below in the visit note. 

## 2013-10-23 NOTE — Patient Instructions (Signed)
-  We have ordered labs or studies at this visit. It can take up to 1-2 weeks for results and processing. We will contact you with instructions IF your results are abnormal. Normal results will be released to your MYCHART. If you have not heard from us or can not find your results in MYCHART in 2 weeks please contact our office.  We recommend the following healthy lifestyle measures: - eat a healthy diet consisting of lots of vegetables, fruits, beans, nuts, seeds, healthy meats such as white chicken and fish and whole grains.  - avoid fried foods, fast food, processed foods, sodas, red meet and other fattening foods.  - get a least 150 minutes of aerobic exercise per week.        

## 2013-10-24 ENCOUNTER — Encounter: Payer: Self-pay | Admitting: *Deleted

## 2013-10-24 LAB — T3: T3, Total: 96.8 ng/dL (ref 80.0–204.0)

## 2014-09-02 LAB — HM PAP SMEAR: HM Pap smear: NORMAL

## 2014-09-04 ENCOUNTER — Encounter: Payer: Self-pay | Admitting: Family Medicine

## 2015-04-11 ENCOUNTER — Encounter: Payer: Self-pay | Admitting: Family Medicine

## 2015-04-11 ENCOUNTER — Ambulatory Visit (INDEPENDENT_AMBULATORY_CARE_PROVIDER_SITE_OTHER)
Admission: RE | Admit: 2015-04-11 | Discharge: 2015-04-11 | Disposition: A | Discharge status: Home / Self Care | Payer: BLUE CROSS/BLUE SHIELD | Source: Ambulatory Visit | Attending: Family Medicine | Admitting: Family Medicine

## 2015-04-11 ENCOUNTER — Ambulatory Visit (INDEPENDENT_AMBULATORY_CARE_PROVIDER_SITE_OTHER): Payer: BLUE CROSS/BLUE SHIELD | Admitting: Family Medicine

## 2015-04-11 VITALS — BP 90/58 | HR 71 | Temp 98.3°F | Ht 65.0 in | Wt 149.8 lb

## 2015-04-11 DIAGNOSIS — R0789 Other chest pain: Secondary | ICD-10-CM

## 2015-04-11 DIAGNOSIS — W57XXXA Bitten or stung by nonvenomous insect and other nonvenomous arthropods, initial encounter: Secondary | ICD-10-CM | POA: Diagnosis not present

## 2015-04-11 DIAGNOSIS — T148 Other injury of unspecified body region: Secondary | ICD-10-CM

## 2015-04-11 NOTE — Progress Notes (Signed)
Pre visit review using our clinic review tool, if applicable. No additional management support is needed unless otherwise documented below in the visit note. 
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
## 2015-04-11 NOTE — Patient Instructions (Signed)
BEFORE YOU LEAVE: -xray sheet -schedule physical in 1-2 months  Go get xray.  Heat 15 minutes twice daily.  Tylenol 500-1000mg  up to 3 times daily for pain as needed.

## 2015-04-14 ENCOUNTER — Encounter: Payer: Self-pay | Admitting: Family Medicine

## 2015-04-14 ENCOUNTER — Ambulatory Visit (INDEPENDENT_AMBULATORY_CARE_PROVIDER_SITE_OTHER): Payer: BLUE CROSS/BLUE SHIELD | Admitting: Family Medicine

## 2015-04-14 VITALS — BP 92/62 | HR 74 | Temp 97.8°F | Ht 64.0 in | Wt 147.0 lb

## 2015-04-14 DIAGNOSIS — Z0000 Encounter for general adult medical examination without abnormal findings: Secondary | ICD-10-CM | POA: Diagnosis not present

## 2015-04-14 LAB — LIPID PANEL
Cholesterol: 225 mg/dL — ABNORMAL HIGH (ref 0–200)
HDL: 67.7 mg/dL (ref >39.00)
LDL Cholesterol: 131 mg/dL — ABNORMAL HIGH (ref 0–99)
NonHDL: 157.22
Total CHOL/HDL Ratio: 3
Triglycerides: 129 mg/dL (ref 0.0–149.0)
VLDL: 25.8 mg/dL (ref 0.0–40.0)

## 2015-04-14 LAB — HEMOGLOBIN A1C: Hgb A1c MFr Bld: 5.7 % (ref 4.6–6.5)

## 2015-04-14 NOTE — Progress Notes (Signed)
HPI:  Here for CPE:  -Concerns and/or follow up today: none. Chest wall pain is resolving.   -Diet: variety of foods, balance and well rounded  -Exercise: no regular exercise  -Taking folic acid, vitamin D or calcium: no  -Diabetes and Dyslipidemia Screening:FASTING for labs  -Hx of HTN: no  -Vaccines: UTD  -pap history: 08/2013 [facility]  -FDLMP:n/a  -sexual activity: yes, female partner, no new partners  -wants STI testing (Hep C if born [birth year]): no  -FH breast, colon or ovarian ca: see FH Last mammogram: 09/2013 with [facility] Last colon cancer screening: 2012 and normal per her report - done in [place]  Breast Ca Risk Assessment: -does breast health with her gynecologist  -Alcohol, Tobacco, drug use: see social history  Review of Systems - no fevers, unintentional weight loss, vision loss, hearing loss, chest pain, sob, hemoptysis, melena, hematochezia, hematuria, genital discharge, changing or concerning skin lesions, bleeding, bruising, loc, thoughts of self harm or SI  Past Medical History  Diagnosis Date  . Hyperlipidemia     Past Surgical History  Procedure Laterality Date  . Breast biopsy      left    Family History  Problem Relation Age of Onset  . Cancer Mother     cancer  . Diabetes Mother   . Heart disease Father     CABG  . Diabetes Father   . Hypertension Father   . Dementia Father     Social History   Social History  . Marital Status: Married    Spouse Name: N/A  . Number of Children: N/A  . Years of Education: N/A   Social History Main Topics  . Smoking status: Never Smoker   . Smokeless tobacco: None  . Alcohol Use: Yes  . Drug Use: No  . Sexual Activity: Not Asked   Other Topics Concern  . 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  EXAM:  Filed Vitals:   04/14/15 0843  BP: 92/62  Pulse: 74  Temp: 97.8 F (36.6 C)    GENERAL: vitals reviewed and listed below, alert, oriented, appears well hydrated and in no acute distress  HEENT: head atraumatic, PERRLA, normal appearance of eyes, ears, nose and mouth. moist mucus membranes.  NECK: supple, no masses or lymphadenopathy  LUNGS: clear to auscultation bilaterally, no rales, rhonchi or wheeze  CV: HRRR, no peripheral edema or cyanosis, normal pedal pulses  BREAST: declined  ABDOMEN: bowel sounds normal, soft, non tender to palpation, no masses, no rebound or guarding  ZO:[id number]  SKIN: no rash or abnormal lesions, few nevi on back, none suspicious in appearance  MS: normal gait, moves all extremities normally  NEURO: CN II-XII grossly intact, normal muscle strength and sensation to light touch on extremities  PSYCH: normal affect, pleasant and cooperative  ASSESSMENT AND PLAN:  Discussed the following assessment and plan:  Visit for preventive health examination - Plan: Lipid Panel, Hemoglobin A1c  -Discussed and advised all [place] preventive services health task force level A and B recommendations for age, sex and risks.  -Advised at least 150 minutes of exercise per week and a healthy diet low  in saturated fats and sweets and consisting of fresh fruits and vegetables, lean meats such as fish and white chicken and whole grains.  -requested pt provide us with mammo and colon reports  -advised good sun protection  -FASTING labs, studies and vaccines per orders this encounter  Orders Placed This Encounter  Procedures  . Lipid Panel  . Hemoglobin A1c    Patient advised to return to clinic immediately if symptoms worsen or persist or new concerns.  Patient Instructions  BEFORE YOU LEAVE: -labs -follow up in 1 year for physical  Get mammogram yearly with your  gynecologist  Vit D3 [phone] IU daily  Please provide us with a copy of your colonoscopy report  -We have ordered labs or studies at this visit. It can take up to 1-2 weeks for results and processing. We will contact you with instructions IF your results are abnormal. Normal results will be released to [facility]. If you have not heard from us or can not find your results in [facility] in 2 weeks please contact our office.  We recommend the following healthy lifestyle measures: - eat a healthy whole foods diet consisting of regular small meals composed of vegetables, fruits, beans, nuts, seeds, healthy meats such as white chicken and fish and whole grains.  - avoid sweets, white starchy foods, fried foods, fast food, processed foods, sodas, red meet and other fattening foods.  - get a least 150-300 minutes of aerobic exercise per week.           No Follow-up on file.  Kriste BasqueKIM, Finlee Concepcion R.

## 2015-04-14 NOTE — Progress Notes (Signed)
Pre visit review using our clinic review tool, if applicable. No additional management support is needed unless otherwise documented below in the visit note. 

## 2015-04-14 NOTE — Patient Instructions (Addendum)
BEFORE YOU LEAVE: -labs -follow up in 1 year for physical  Get mammogram yearly with your gynecologist  Vit D3 [phone] IU daily  Please provide us with a copy of your colonoscopy report  -We have ordered labs or studies at this visit. It can take up to 1-2 weeks for results and processing. We will contact you with instructions IF your results are abnormal. Normal results will be [facility] to your [facility]. If you have not heard from us or can not find your results in [facility] in 2 weeks please contact our office.  We recommend the following healthy lifestyle measures: - eat a healthy whole foods diet consisting of regular small meals composed of vegetables, fruits, beans, nuts, seeds, healthy meats such as white chicken and fish and whole grains.  - avoid sweets, white starchy foods, fried foods, fast food, processed foods, sodas, red meet and other fattening foods.  - get a least 150-300 minutes of aerobic exercise per week.

## 2015-08-26 ENCOUNTER — Ambulatory Visit (INDEPENDENT_AMBULATORY_CARE_PROVIDER_SITE_OTHER): Payer: BLUE CROSS/BLUE SHIELD | Admitting: Family Medicine

## 2015-08-26 ENCOUNTER — Encounter: Payer: Self-pay | Admitting: Family Medicine

## 2015-08-26 VITALS — BP 94/60 | HR 77 | Temp 98.1°F | Ht 64.0 in | Wt 147.7 lb

## 2015-08-26 DIAGNOSIS — J069 Acute upper respiratory infection, unspecified: Secondary | ICD-10-CM | POA: Diagnosis not present

## 2015-08-26 NOTE — Progress Notes (Signed)
Pre visit review using our clinic review tool, if applicable. No additional management support is needed unless otherwise documented below in the visit note. 

## 2015-08-26 NOTE — Progress Notes (Signed)
HPI:  Sore throat: -started: yesterday -symptoms:nasal congestion, sore throat, pnd -denies:fever, SOB, NVD, tooth pain -has tried: nothing -sick contacts/travel/risks: no reported flu, strep or tick exposure  ROS: See pertinent positives and negatives per HPI.  Past Medical History:  Diagnosis Date  . Hyperlipidemia     Past Surgical History:  Procedure Laterality Date  . BREAST BIOPSY     left    Family History  Problem Relation Age of Onset  . Cancer Mother     cancer  . Diabetes Mother   . Heart disease Father     CABG  . Diabetes Father   . Hypertension Father   . Dementia Father     Social History   Social History  . Marital status: Married    Spouse name: N/A  . Number of children: N/A  . Years of education: N/A   Social History Main Topics  . Smoking status: Never Smoker  . Smokeless tobacco: None  . Alcohol use Yes  . Drug use: No  . Sexual activity: Not Asked   Other Topics Concern  . 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  EXAM:  Vitals:   08/26/15 1403  BP: 94/60  Pulse: 77  Temp: 98.1 F (36.7 C)    Body mass index is 25.35 kg/m.  GENERAL: vitals reviewed and listed above, alert, oriented, appears well hydrated and in no acute distress  HEENT: atraumatic, conjunttiva clear, no obvious abnormalities on inspection of external nose and ears, normal appearance of ear canals and TMs, clear nasal congestion, mild post oropharyngeal erythema with PND, no tonsillar edema or exudate, no sinus TTP  NECK: no obvious masses on inspection  LUNGS: clear to auscultation bilaterally, no wheezes, rales or rhonchi, good air movement  CV: HRRR, no peripheral edema  MS: moves all extremities without noticeable  abnormality  PSYCH: pleasant and cooperative, no obvious depression or anxiety  ASSESSMENT AND PLAN:  Discussed the following assessment and plan:  Viral upper respiratory illness  -given HPI and exam findings today, a serious infection or illness is unlikely. We discussed potential etiologies, with VURI being most likely, and advised supportive care and monitoring. We discussed treatment side effects, likely course, antibiotic misuse, transmission, and signs of developing a serious illness. -of course, we advised to return or notify a doctor immediately if symptoms worsen or persist or new concerns arise.    Patient Instructions  INSTRUCTIONS FOR UPPER RESPIRATORY INFECTION:  -plenty of rest and fluids  -nasal saline wash 2-3 times daily (use prepackaged nasal saline or bottled/distilled water if making your own)   -can use AFRIN nasal spray for drainage and nasal congestion - but do NOT use longer then 3-4 days  -can use tylenol (in no history of liver disease) or ibuprofen (if no history of kidney disease, bowel bleeding or significant heart disease) as directed for aches and sorethroat  -if you are taking a cough medication - use only as directed, may also try a teaspoon of honey to coat the throat and throat lozenges.  -for sore throat, salt water gargles can help  -follow up if you have fevers, facial pain, tooth pain, difficulty breathing or  are worsening or symptoms persist longer then expected  Upper Respiratory Infection, Adult An upper respiratory infection (URI) is also known as the common cold. It is often caused by a type of germ (virus). Colds are easily spread (contagious). You can pass it to others by kissing, coughing, sneezing, or drinking out of the same glass. Usually, you get better in 1 to 3  weeks.  However, the cough can last for even longer. HOME CARE   Only take medicine as told by your doctor. Follow instructions provided above.  Drink enough water  and fluids to keep your pee (urine) clear or pale yellow.  Get plenty of rest.  Return to work when your temperature is < 100 for 24 hours or as told by your doctor. You may use a face mask and wash your hands to stop your cold from spreading. GET HELP RIGHT AWAY IF:   After the first few days, you feel you are getting worse.  You have questions about your medicine.  You have chills, shortness of breath, or red spit (mucus).  You have pain in the face for more then 1-2 days, especially when you bend forward.  You have a bad headache, ear pain, sinus pain, or chest pain.  You have a high-pitched whistling sound when you breathe in and out (wheezing).  You cough up blood.  You have sore muscles or a stiff neck. MAKE SURE YOU:   Understand these instructions.  Will watch your condition.  Will get help right away if you are not doing well or get worse. Document Released: 07/07/2007 Document Revised: 04/12/2011 Document Reviewed: 04/25/2013 [facility] Patient Information 2015 [place], [place]. This information is not intended to replace advice given to you by your health care provider. Make sure you discuss any questions you have with your health care provider.    Kriste Basque R., DO

## 2015-08-26 NOTE — Patient Instructions (Signed)
INSTRUCTIONS FOR UPPER RESPIRATORY INFECTION:  -plenty of rest and fluids  -nasal saline wash 2-3 times daily (use prepackaged nasal saline or bottled/distilled water if making your own)   -can use AFRIN nasal spray for drainage and nasal congestion - but do NOT use longer then 3-4 days  -can use tylenol (in no history of liver disease) or ibuprofen (if no history of kidney disease, bowel bleeding or significant heart disease) as directed for aches and sorethroat  -if you are taking a cough medication - use only as directed, may also try a teaspoon of honey to coat the throat and throat lozenges.  -for sore throat, salt water gargles can help  -follow up if you have fevers, facial pain, tooth pain, difficulty breathing or are worsening or symptoms persist longer then expected  Upper Respiratory Infection, Adult An upper respiratory infection (URI) is also known as the common cold. It is often caused by a type of germ (virus). Colds are easily spread (contagious). You can pass it to others by kissing, coughing, sneezing, or drinking out of the same glass. Usually, you get better in 1 to 3  weeks.  However, the cough can last for even longer. HOME CARE   Only take medicine as told by your doctor. Follow instructions provided above.  Drink enough water and fluids to keep your pee (urine) clear or pale yellow.  Get plenty of rest.  Return to work when your temperature is < 100 for 24 hours or as told by your doctor. You may use a face mask and wash your hands to stop your cold from spreading. GET HELP RIGHT AWAY IF:   After the first few days, you feel you are getting worse.  You have questions about your medicine.  You have chills, shortness of breath, or red spit (mucus).  You have pain in the face for more then 1-2 days, especially when you bend forward.  You have a bad headache, ear pain, sinus pain, or chest pain.  You have a high-pitched whistling sound when you breathe in  and out (wheezing).  You cough up blood.  You have sore muscles or a stiff neck. MAKE SURE YOU:   Understand these instructions.  Will watch your condition.  Will get help right away if you are not doing well or get worse. Document Released: 07/07/2007 Document Revised: 04/12/2011 Document Reviewed: 04/25/2013 [facility] Patient Information 2015 [place], [place]. This information is not intended to replace advice given to you by your health care provider. Make sure you discuss any questions you have with your health care provider.

## 2015-11-04 DIAGNOSIS — Z124 Encounter for screening for malignant neoplasm of cervix: Secondary | ICD-10-CM | POA: Diagnosis not present

## 2015-11-04 DIAGNOSIS — Z01419 Encounter for gynecological examination (general) (routine) without abnormal findings: Secondary | ICD-10-CM | POA: Diagnosis not present

## 2015-11-04 DIAGNOSIS — Z6825 Body mass index (BMI) 25.0-25.9, adult: Secondary | ICD-10-CM | POA: Diagnosis not present

## 2015-11-04 DIAGNOSIS — N898 Other specified noninflammatory disorders of vagina: Secondary | ICD-10-CM | POA: Diagnosis not present

## 2015-11-07 DIAGNOSIS — Z124 Encounter for screening for malignant neoplasm of cervix: Secondary | ICD-10-CM | POA: Diagnosis not present

## 2015-11-07 DIAGNOSIS — N898 Other specified noninflammatory disorders of vagina: Secondary | ICD-10-CM | POA: Diagnosis not present

## 2015-11-21 DIAGNOSIS — Z6824 Body mass index (BMI) 24.0-24.9, adult: Secondary | ICD-10-CM | POA: Diagnosis not present

## 2015-11-21 DIAGNOSIS — Z1231 Encounter for screening mammogram for malignant neoplasm of breast: Secondary | ICD-10-CM | POA: Diagnosis not present

## 2016-01-05 ENCOUNTER — Encounter: Payer: Self-pay | Admitting: Podiatry

## 2016-01-05 ENCOUNTER — Ambulatory Visit (INDEPENDENT_AMBULATORY_CARE_PROVIDER_SITE_OTHER): Payer: BLUE CROSS/BLUE SHIELD

## 2016-01-05 ENCOUNTER — Ambulatory Visit (INDEPENDENT_AMBULATORY_CARE_PROVIDER_SITE_OTHER): Payer: BLUE CROSS/BLUE SHIELD | Admitting: Podiatry

## 2016-01-05 VITALS — BP 107/65 | HR 66

## 2016-01-05 DIAGNOSIS — M79672 Pain in left foot: Secondary | ICD-10-CM | POA: Diagnosis not present

## 2016-01-05 DIAGNOSIS — R52 Pain, unspecified: Secondary | ICD-10-CM

## 2016-01-05 DIAGNOSIS — M722 Plantar fascial fibromatosis: Secondary | ICD-10-CM | POA: Diagnosis not present

## 2016-01-05 MED ORDER — BETAMETHASONE SOD PHOS & ACET 6 (3-3) MG/ML IJ SUSP: 3.0000 mg | Freq: Once | INTRAMUSCULAR | Status: DC

## 2016-01-05 NOTE — Progress Notes (Signed)
Subjective: Patient presents today for pain and tenderness in the left foot. Patient states the foot pain has been hurting for several weeks now. Patient states that it hurts in the mornings with the first steps out of bed. Patient presents today for further treatment and evaluation  Objective: Physical Exam General: The patient is alert and oriented x3 in no acute distress.  Dermatology: Skin is warm, dry and supple bilateral lower extremities. Negative for open lesions or macerations bilateral.   Vascular: Dorsalis Pedis and Posterior Tibial pulses palpable bilateral.  Capillary fill time is immediate to all digits.  Neurological: Epicritic and protective threshold intact bilateral.   Musculoskeletal: Tenderness to palpation at the medial calcaneal tubercale and through the insertion of the plantar fascia of the left foot. All other joints range of motion within normal limits bilateral. Strength 5/5 in all groups bilateral.   Radiographic exam:   Normal osseous mineralization. Joint spaces preserved. No fracture/dislocation/boney destruction. Calcaneal spur present with mild thickening of plantar fascia left. No other soft tissue abnormalities or radiopaque foreign bodies.   Assessment: 1. Plantar fasciitis left foot 2. Pain in left foot  Plan of Care:   1. Patient evaluated. Xrays reviewed.   2. Patient refused injections, oral anti-inflammatories, and topical anti-inflammatory pain cream. Patient does not like to take medication unless it is absolutely necessary 3. Plantar fascial brace dispensed 4. Today the patient was scanned for custom molded orthotics 5. Discussed all conservative modalities to alleviate symptoms of plantar fasciitis including supportive shoe gear, stretching, icing 6. Return to clinic in 4 weeks

## 2016-02-09 ENCOUNTER — Ambulatory Visit (INDEPENDENT_AMBULATORY_CARE_PROVIDER_SITE_OTHER): Payer: BLUE CROSS/BLUE SHIELD | Admitting: Podiatry

## 2016-02-09 DIAGNOSIS — M722 Plantar fascial fibromatosis: Secondary | ICD-10-CM | POA: Diagnosis not present

## 2016-02-09 NOTE — Progress Notes (Signed)
Patient ID: Emily Joyce, female   DOB: 05/08/1960, 56 y.o.   MRN: [id number]   Patient presents for orthotic pick up.  Verbal and written break in and wear instructions given.  Patient will follow up in 4 weeks if symptoms worsen or fail to improve.

## 2016-02-09 NOTE — Patient Instructions (Signed)

## 2016-02-10 NOTE — Progress Notes (Signed)
Patient ID: Emily Joyce, female   DOB: 1960/05/20, 56 y.o.   MRN: [id number] Subjective: She presents today for follow-up evaluation of plantar fasciitis left foot. Patient states that she can still continues to feel pain and tenderness to the left heel despite stretching and conservative modalities. Patient also presents today to pick up her orthotics  Objective: Physical Exam General: The patient is alert and oriented x3 in no acute distress.  Dermatology: Skin is warm, dry and supple bilateral lower extremities. Negative for open lesions or macerations bilateral.   Vascular: Dorsalis Pedis and Posterior Tibial pulses palpable bilateral.  Capillary fill time is immediate to all digits.  Neurological: Epicritic and protective threshold intact bilateral.   Musculoskeletal: Tenderness to palpation at the medial calcaneal tubercale and through the insertion of the plantar fascia of the left foot. All other joints range of motion within normal limits bilateral. Strength 5/5 in all groups bilateral.   Assessment: 1. Plantar fasciitis left foot 2. Pain in left foot  Plan of Care:   1. Patient evaluated. Orthotics dispensed with break-in instructions provided.  2. The patient asked about wearing a CAM boot for immobilization. I informed the patient that although the cam boot may help let first start with the custom molded orthotics that she's picking up today. 3. Today we also discussed shockwave therapy. The patient is considering whether or not she wants to do shockwave therapy. 4. Return to clinic in 4 weeks  Felecia ShellingBrent M. Kamy Poinsett, DPM [facility]  Dr. Felecia ShellingBrent M. Anni Hocevar, DPM    [address]                                        [place], [place] [address]                Office [phone]  Fax [phone]

## 2016-03-08 ENCOUNTER — Ambulatory Visit: Payer: BLUE CROSS/BLUE SHIELD | Admitting: Podiatry

## 2016-03-22 ENCOUNTER — Telehealth: Payer: Self-pay | Admitting: *Deleted

## 2016-03-22 NOTE — Telephone Encounter (Signed)
Pt left name and phone number. Pt states she is calling for dtr, Dominque Ricci.

## 2016-11-08 ENCOUNTER — Encounter: Payer: Self-pay | Admitting: Family Medicine

## 2016-11-08 ENCOUNTER — Ambulatory Visit (INDEPENDENT_AMBULATORY_CARE_PROVIDER_SITE_OTHER): Payer: BLUE CROSS/BLUE SHIELD | Admitting: Family Medicine

## 2016-11-08 VITALS — BP 100/80 | HR 74 | Temp 98.9°F | Ht 64.0 in | Wt 155.0 lb

## 2016-11-08 DIAGNOSIS — M791 Myalgia, unspecified site: Secondary | ICD-10-CM | POA: Diagnosis not present

## 2016-11-08 DIAGNOSIS — M9902 Segmental and somatic dysfunction of thoracic region: Secondary | ICD-10-CM

## 2016-11-08 DIAGNOSIS — J069 Acute upper respiratory infection, unspecified: Secondary | ICD-10-CM

## 2016-11-08 DIAGNOSIS — M9901 Segmental and somatic dysfunction of cervical region: Secondary | ICD-10-CM

## 2016-11-08 DIAGNOSIS — M542 Cervicalgia: Secondary | ICD-10-CM | POA: Diagnosis not present

## 2016-11-08 DIAGNOSIS — R5383 Other fatigue: Secondary | ICD-10-CM | POA: Diagnosis not present

## 2016-11-08 LAB — CBC WITH DIFFERENTIAL/PLATELET
Basophils Absolute: 0 10*3/uL (ref 0.0–0.1)
Basophils Relative: 0.6 % (ref 0.0–3.0)
Eosinophils Absolute: 0.1 10*3/uL (ref 0.0–0.7)
Eosinophils Relative: 1 % (ref 0.0–5.0)
HCT: 37.6 % (ref 36.0–46.0)
Hemoglobin: 12.9 g/dL (ref 12.0–15.0)
Lymphocytes Relative: 21.1 % (ref 12.0–46.0)
Lymphs Abs: 1.5 10*3/uL (ref 0.7–4.0)
MCHC: 34.2 g/dL (ref 30.0–36.0)
MCV: 89.3 fl (ref 78.0–100.0)
Monocytes Absolute: 0.6 10*3/uL (ref 0.1–1.0)
Monocytes Relative: 8.4 % (ref 3.0–12.0)
Neutro Abs: 4.9 10*3/uL (ref 1.4–7.7)
Neutrophils Relative %: 68.9 % (ref 43.0–77.0)
Platelets: 237 10*3/uL (ref 150.0–400.0)
RBC: 4.22 Mil/uL (ref 3.87–5.11)
RDW: 12.9 % (ref 11.5–15.5)
WBC: 7.1 10*3/uL (ref 4.0–10.5)

## 2016-11-08 LAB — COMPREHENSIVE METABOLIC PANEL
ALT: 17 U/L (ref 0–35)
AST: 16 U/L (ref 0–37)
Albumin: 4.6 g/dL (ref 3.5–5.2)
Alkaline Phosphatase: 115 U/L (ref 39–117)
BUN: 12 mg/dL (ref 6–23)
CO2: 29 mEq/L (ref 19–32)
Calcium: 9.5 mg/dL (ref 8.4–10.5)
Chloride: 102 mEq/L (ref 96–112)
Creatinine, Ser: 0.79 mg/dL (ref 0.40–1.20)
GFR: 79.76 mL/min (ref >60.00)
Glucose, Bld: 87 mg/dL (ref 70–99)
Potassium: 4.2 mEq/L (ref 3.5–5.1)
Sodium: 139 mEq/L (ref 135–145)
Total Bilirubin: 0.8 mg/dL (ref 0.2–1.2)
Total Protein: 7.5 g/dL (ref 6.0–8.3)

## 2016-11-08 LAB — VITAMIN B12: Vitamin B-12: 265 pg/mL (ref 211–911)

## 2016-11-08 NOTE — Progress Notes (Signed)
HPI:  Emily Joyce If the pleasant 56 year old here for an acute visit for feeling unwell. She reports her symptoms started about a week and half ago. Symptoms include postnasal drip, cough, congestion, headache, body aches, vomited 1 last week when symptoms initially started and after vomiting has developed some right-sided neck pain the next day with muscle soreness. She is feeling a little better, but most of her symptoms persist some still. Denies fever, any recurrent vomiting, diarrhea, rashes, tick bite, sore throat, shortness of breath, weakness, numbness, radicular symptoms, dysuria.  ROS: See pertinent positives and negatives per HPI.  Past Medical History:  Diagnosis Date  . Hyperlipidemia     Past Surgical History:  Procedure Laterality Date  . BREAST BIOPSY     left    Family History  Problem Relation Age of Onset  . Cancer Mother        cancer  . Diabetes Mother   . Heart disease Father        CABG  . Diabetes Father   . Hypertension Father   . Dementia Father     Social History   Social History  . Marital status: Married    Spouse name: N/A  . Number of children: N/A  . Years of education: N/A   Social History Main Topics  . Smoking status: Never Smoker  . Smokeless tobacco: Never Used  . Alcohol use Yes  . Drug use: No  . Sexual activity: Not Asked   Other Topics Concern  . 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  Current Facility-Administered Medications:  .  betamethasone acetate-betamethasone sodium phosphate (CELESTONE) injection 3 mg, 3 mg, Intramuscular, Once, [name], DPM  EXAM:  Vitals:   11/08/16 1035  BP: 100/80  Pulse: 74  Temp: 98.9 F (37.2 C)  SpO2: 98%    Body mass index is 26.61  kg/m.  GENERAL: vitals reviewed and listed above, alert, oriented, appears well hydrated and in no acute distress  HEENT: atraumatic, conjunttiva clear, no obvious abnormalities on inspection of external nose and ears, normal appearance of ear canals and TMs, clear nasal congestion, mild post oropharyngeal erythema with PND, no tonsillar edema or exudate, no sinus TTP  NECK: no obvious masses on inspection  LUNGS: clear to auscultation bilaterally, no wheezes, rales or rhonchi, good air movement  CV: HRRR, no peripheral edema  MS: moves all extremities without noticeable abnormality; normal gait, she has some tenderness to palpation and a counterstrain tender point in the suboccipital muscles on the right and also the right trapezius mid fibers - her symptoms are reproducible, no bony tenderness to palpation, no meningeal signs, normal function of the upper extremities bilaterally, negative Spurling testing, negative vertebral artery insufficiency testing, normal range of motion of head and neck  PSYCH: pleasant and cooperative, no obvious depression or anxiety  ASSESSMENT AND PLAN:  Discussed the following assessment and plan:  Neck pain  Muscle soreness  Fatigue, unspecified type - Plan: Comprehensive metabolic panel, CBC with Differential/Platelet, Vitamin B12  Upper respiratory tract infection, unspecified type  Somatic dysfunction of spine, cervical  Somatic dysfunction of thoracic region  -she reports she does not respond well  to taking medications that she is very sensitive - after discussion of potential etiologies for her muscle soreness and neck pain, I do think she has had a muscle strain we opted to treat this conservatively. He'll use heat, topical menthol, gentle home exercises and only as needed over-the-counter analgesics. She also opted to do osteopathic treatment today with very gentle soft tissue treatments. She responded well. See below. - other symptoms are  consistent with a viral illness, but seems to be hopefully resolving. We will do some basic labs given she has been sick for about a week and half.conservative management. -Follow up in 2-3 weeks -Patient advised to return or notify a doctor immediately if symptoms worsen or persist or new concerns arise.  PROCEDURE NOTE : OSTEOPATHIC TREATMENT The decision today to treat with gentle Osteopathic Manipulative Therapy  (OMT) was based on physical exam findings. Verbal consent was  obtained after after explanation of risks and benefits. No Cervical HVLA manipulation was performed. After consent was obtained, treatment was  performed as below:      Regions treated: cervical, thoracic     Techniques used: counterstrain, myofascial release The patient tolerated the treatment well and reported Improved  symptoms following treatment today. Follow up treatment was advised in: 1-2 weeks   Patient Instructions  BEFORE YOU LEAVE: -neck spasm exercises -follow up: 3 weeks  Heat and Tiger balm (menthol) as needed for muscle soreness. Tylenol or aleve if needed per instructions.  Nasal saline can help with the PND.  Drink plenty of water.  I hope you are feeling better soon! Follow up sooner if worsening, new concerns or you are not improving with treatment.  We have ordered labs or studies at this visit. It can take up to 1-2 weeks for results and processing. IF results require follow up or explanation, we will call you with instructions. Clinically stable results will be released to your [facility]. If you have not heard from [place] or cannot find your results in [facility] in 2 weeks please contact our office at [phone].  If you are not yet signed up for [facility], please consider signing up.           WE NOW OFFER   [facility] FAST TRACK!!!  SAME DAY Appointments for ACUTE CARE  Such as: Sprains, Injuries, cuts, abrasions, rashes, muscle pain, joint pain, back  pain Colds, flu, sore throats, headache, allergies, cough, fever  Ear pain, sinus and eye infections Abdominal pain, nausea, vomiting, diarrhea, upset stomach Animal/insect bites  3 Easy Ways to Schedule: Walk-In Scheduling Call in scheduling Mychart Sign-up: [email or web address]               Kriste Basque R., DO

## 2016-11-08 NOTE — Patient Instructions (Signed)
BEFORE YOU LEAVE: -neck spasm exercises -follow up: 3 weeks  Heat and Tiger balm (menthol) as needed for muscle soreness. Tylenol or aleve if needed per instructions.  Nasal saline can help with the PND.  Drink plenty of water.  I hope you are feeling better soon! Follow up sooner if worsening, new concerns or you are not improving with treatment.  We have ordered labs or studies at this visit. It can take up to 1-2 weeks for results and processing. IF results require follow up or explanation, we will call you with instructions. Clinically stable results will be released to your [facility]. If you have not heard from [place] or cannot find your results in [facility] in 2 weeks please contact our office at [phone].  If you are not yet signed up for [facility], please consider signing up.           WE NOW OFFER   [facility] FAST TRACK!!!  SAME DAY Appointments for ACUTE CARE  Such as: Sprains, Injuries, cuts, abrasions, rashes, muscle pain, joint pain, back pain Colds, flu, sore throats, headache, allergies, cough, fever  Ear pain, sinus and eye infections Abdominal pain, nausea, vomiting, diarrhea, upset stomach Animal/insect bites  3 Easy Ways to Schedule: Walk-In Scheduling Call in scheduling Mychart Sign-up: [email or web address]

## 2016-11-12 ENCOUNTER — Telehealth: Payer: Self-pay | Admitting: *Deleted

## 2016-11-12 NOTE — Telephone Encounter (Signed)
Mrs Margarett stated the pt asked for a return call.  I left a message for the pt to return my call.

## 2016-11-23 DIAGNOSIS — H5213 Myopia, bilateral: Secondary | ICD-10-CM | POA: Diagnosis not present

## 2016-11-29 ENCOUNTER — Ambulatory Visit: Payer: BLUE CROSS/BLUE SHIELD | Admitting: Family Medicine

## 2016-11-30 DIAGNOSIS — Z1231 Encounter for screening mammogram for malignant neoplasm of breast: Secondary | ICD-10-CM | POA: Diagnosis not present

## 2016-11-30 DIAGNOSIS — Z01419 Encounter for gynecological examination (general) (routine) without abnormal findings: Secondary | ICD-10-CM | POA: Diagnosis not present

## 2016-11-30 DIAGNOSIS — Z6825 Body mass index (BMI) 25.0-25.9, adult: Secondary | ICD-10-CM | POA: Diagnosis not present

## 2016-11-30 DIAGNOSIS — Z124 Encounter for screening for malignant neoplasm of cervix: Secondary | ICD-10-CM | POA: Diagnosis not present

## 2017-02-01 HISTORY — PX: BASAL CELL CARCINOMA EXCISION: SHX1214
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
## 2017-03-31 ENCOUNTER — Encounter: Payer: Self-pay | Admitting: Dermatology

## 2017-03-31 DIAGNOSIS — C44311 Basal cell carcinoma of skin of nose: Secondary | ICD-10-CM | POA: Diagnosis not present

## 2017-03-31 DIAGNOSIS — L814 Other melanin hyperpigmentation: Secondary | ICD-10-CM | POA: Diagnosis not present

## 2017-04-26 DIAGNOSIS — C44311 Basal cell carcinoma of skin of nose: Secondary | ICD-10-CM | POA: Diagnosis not present

## 2017-06-24 DIAGNOSIS — Z85828 Personal history of other malignant neoplasm of skin: Secondary | ICD-10-CM | POA: Diagnosis not present

## 2017-06-24 DIAGNOSIS — D225 Melanocytic nevi of trunk: Secondary | ICD-10-CM | POA: Diagnosis not present

## 2017-06-24 DIAGNOSIS — D2271 Melanocytic nevi of right lower limb, including hip: Secondary | ICD-10-CM | POA: Diagnosis not present

## 2017-06-24 DIAGNOSIS — L821 Other seborrheic keratosis: Secondary | ICD-10-CM | POA: Diagnosis not present

## 2017-06-24 DIAGNOSIS — D2261 Melanocytic nevi of right upper limb, including shoulder: Secondary | ICD-10-CM | POA: Diagnosis not present

## 2017-06-24 DIAGNOSIS — L820 Inflamed seborrheic keratosis: Secondary | ICD-10-CM | POA: Diagnosis not present

## 2017-06-24 DIAGNOSIS — D485 Neoplasm of uncertain behavior of skin: Secondary | ICD-10-CM | POA: Diagnosis not present

## 2017-11-01 ENCOUNTER — Ambulatory Visit: Payer: Self-pay

## 2017-11-01 ENCOUNTER — Emergency Department (HOSPITAL_COMMUNITY)
Admission: EM | Admit: 2017-11-01 | Discharge: 2017-11-01 | Disposition: A | Discharge status: Home / Self Care | Payer: BLUE CROSS/BLUE SHIELD | Attending: Emergency Medicine | Admitting: Emergency Medicine

## 2017-11-01 ENCOUNTER — Encounter (HOSPITAL_COMMUNITY): Payer: Self-pay | Admitting: Emergency Medicine

## 2017-11-01 ENCOUNTER — Emergency Department (HOSPITAL_COMMUNITY): Payer: BLUE CROSS/BLUE SHIELD

## 2017-11-01 ENCOUNTER — Other Ambulatory Visit: Payer: Self-pay

## 2017-11-01 DIAGNOSIS — R0789 Other chest pain: Secondary | ICD-10-CM | POA: Diagnosis not present

## 2017-11-01 DIAGNOSIS — R891 Abnormal level of hormones in specimens from other organs, systems and tissues: Secondary | ICD-10-CM | POA: Diagnosis not present

## 2017-11-01 DIAGNOSIS — R079 Chest pain, unspecified: Secondary | ICD-10-CM | POA: Insufficient documentation

## 2017-11-01 DIAGNOSIS — E785 Hyperlipidemia, unspecified: Secondary | ICD-10-CM | POA: Diagnosis not present

## 2017-11-01 LAB — CBC
HCT: 39.8 % (ref 36.0–46.0)
Hemoglobin: 13.1 g/dL (ref 12.0–15.0)
MCH: 29.9 pg (ref 26.0–34.0)
MCHC: 32.9 g/dL (ref 30.0–36.0)
MCV: 90.9 fL (ref 78.0–100.0)
Platelets: 227 10*3/uL (ref 150–400)
RBC: 4.38 MIL/uL (ref 3.87–5.11)
RDW: 12.4 % (ref 11.5–15.5)
WBC: 4.9 10*3/uL (ref 4.0–10.5)

## 2017-11-01 LAB — I-STAT TROPONIN, ED
Troponin i, poc: 0 ng/mL (ref 0.00–0.08)
Troponin i, poc: 0 ng/mL (ref 0.00–0.08)

## 2017-11-01 LAB — I-STAT BETA HCG BLOOD, ED (MC, WL, AP ONLY): I-stat hCG, quantitative: 12 m[IU]/mL — ABNORMAL HIGH (ref <5)

## 2017-11-01 LAB — BASIC METABOLIC PANEL
Anion gap: 10 (ref 5–15)
BUN: 10 mg/dL (ref 6–20)
CO2: 24 mmol/L (ref 22–32)
Calcium: 9.6 mg/dL (ref 8.9–10.3)
Chloride: 105 mmol/L (ref 98–111)
Creatinine, Ser: 0.86 mg/dL (ref 0.44–1.00)
GFR calc Af Amer: >60 mL/min (ref >60)
GFR calc non Af Amer: >60 mL/min (ref >60)
Glucose, Bld: 120 mg/dL — ABNORMAL HIGH (ref 70–99)
Potassium: 3.8 mmol/L (ref 3.5–5.1)
Sodium: 139 mmol/L (ref 135–145)

## 2017-11-01 NOTE — Telephone Encounter (Signed)
Will monitor for ED arrival.  

## 2017-11-01 NOTE — ED Triage Notes (Signed)
Patient states she has had intermittent central chest pain since yesterday. Describes it as dull. Pt adds she felt dizzy when she first woke.

## 2017-11-01 NOTE — Telephone Encounter (Signed)
Pt has arrived at [facility] ED.  

## 2017-11-01 NOTE — ED Provider Notes (Signed)
[facility] EMERGENCY DEPARTMENT Provider Note   CSN: [id number] Arrival date & time: 11/01/17  4098     History   Chief Complaint Chief Complaint  Patient presents with  . Chest Pain    HPI Emily Joyce is a 57 y.o. female.  The history is provided by the patient.  Chest Pain   This is a new problem. The current episode started yesterday. The problem occurs rarely. The problem has been gradually improving. The pain is associated with movement and rest. The pain is present in the lateral region. The pain is at a severity of 3/10. The pain is mild. The quality of the pain is described as dull. The pain does not radiate. The symptoms are aggravated by certain positions. Pertinent negatives include no abdominal pain, no back pain, no claudication, no cough, no dizziness, no exertional chest pressure, no fever, no hemoptysis, no malaise/fatigue, no nausea, no near-syncope, no palpitations, no shortness of breath, no sputum production, no syncope, no vomiting and no weakness. She has tried nothing for the symptoms. The treatment provided no relief.  Pertinent negatives for past medical history include no CAD, no hyperhomocysteinemia, no hyperlipidemia, no hypertension, no PE and no seizures.  Pertinent negatives for family medical history include: no CAD.  Procedure history is negative for cardiac catheterization.    Past Medical History:  Diagnosis Date  . Hyperlipidemia     Patient Active Problem List   Diagnosis Date Noted  . Borderline abnormal TFTs 10/23/2013  . Hyperlipemia 10/23/2013    Past Surgical History:  Procedure Laterality Date  . BREAST BIOPSY     left     OB History   None      Home Medications    Prior to Admission medications   Not on File    Family History Family History  Problem Relation Age of Onset  . Cancer Mother        cancer  . Diabetes Mother   . Heart disease Father        CABG  . Diabetes Father   .  Hypertension Father   . Dementia Father     Social History Social History   Tobacco Use  . Smoking status: Never Smoker  . Smokeless tobacco: Never Used  Substance Use Topics  . Alcohol use: Yes  . Drug use: No     Allergies   Patient has no known allergies.   Review of Systems Review of Systems  Constitutional: Negative for chills, fever and malaise/fatigue.  HENT: Negative for ear pain and sore throat.   Eyes: Negative for pain and visual disturbance.  Respiratory: Negative for cough, hemoptysis, sputum production and shortness of breath.   Cardiovascular: Positive for chest pain. Negative for palpitations, claudication, syncope and near-syncope.  Gastrointestinal: Negative for abdominal pain, nausea and vomiting.  Genitourinary: Negative for dysuria and hematuria.  Musculoskeletal: Negative for arthralgias and back pain.  Skin: Negative for color change and rash.  Neurological: Negative for dizziness, seizures, syncope and weakness.  All other systems reviewed and are negative.    Physical Exam Updated Vital Signs  ED Triage Vitals  Enc Vitals Group     BP 11/01/17 1000 126/76     Pulse Rate 11/01/17 1000 72     Resp 11/01/17 1000 16     Temp 11/01/17 1000 97.8 F (36.6 C)     Temp Source 11/01/17 1000 Oral     SpO2 11/01/17 1000 99 %  Weight --      Height --      Head Circumference --      Peak Flow --      Pain Score 11/01/17 1013 3     Pain Loc --      Pain Edu? --      Excl. in GC? --     Physical Exam  Constitutional: She appears well-developed and well-nourished. No distress.  HENT:  Head: Normocephalic and atraumatic.  Eyes: Pupils are equal, round, and reactive to light. Conjunctivae and EOM are normal.  Neck: Normal range of motion. Neck supple.  Cardiovascular: Normal rate, regular rhythm, intact distal pulses and normal pulses.  No murmur heard. Pulmonary/Chest: Effort normal and breath sounds normal. No respiratory distress. She has  no decreased breath sounds. She has no wheezes. She has no rales.  Abdominal: Soft. Bowel sounds are normal. There is no tenderness.  Musculoskeletal: She exhibits no edema.       Right lower leg: She exhibits no edema.       Left lower leg: She exhibits no edema.  TTP to left side of chest wall, mild  Neurological: She is alert.  Skin: Skin is warm and dry.  Psychiatric: She has a normal mood and affect.  Nursing note and vitals reviewed.    ED Treatments / Results  Labs (all labs ordered are listed, but only abnormal results are displayed) Labs Reviewed  BASIC METABOLIC PANEL - Abnormal; Notable for the following components:      Result Value   Glucose, Bld 120 (*)    All other components within normal limits  I-STAT BETA HCG BLOOD, ED ([facility], [facility], [facility] ONLY) - Abnormal; Notable for the following components:   I-stat hCG, quantitative 12.0 (*)    All other components within normal limits  CBC  I-STAT TROPONIN, ED  I-STAT TROPONIN, ED    EKG EKG Interpretation  Date/Time:  Tuesday November 01 2017 09:59:55 EDT Ventricular Rate:  73 PR Interval:  146 QRS Duration: 88 QT Interval:  404 QTC Calculation: 445 R Axis:   62 Text Interpretation:  Normal sinus rhythm with sinus arrhythmia Low voltage QRS Borderline ECG Confirmed by Virgina Norfolk [phone]) on 11/01/2017 11:57:15 AM   Radiology Dg Chest 2 View  Result Date: 11/01/2017 CLINICAL DATA:  Chest pain.  Dizziness and weakness. EXAM: CHEST - 2 VIEW COMPARISON:  04/11/2015. FINDINGS: Mediastinum hilar structures normal. Mild left base subsegmental atelectasis and or scarring. No pleural effusion or pneumothorax. No acute bony abnormality IMPRESSION: Mild left base subsegmental atelectasis and or scarring. Electronically Signed   By: Maisie Fus  Register   On: 11/01/2017 10:47    Procedures Procedures (including critical care time)  Medications Ordered in ED Medications - No data to display   Initial Impression / Assessment and  Plan / ED Course  I have reviewed the triage vital signs and the nursing notes.  Pertinent labs & imaging results that were available during my care of the patient were reviewed by me and considered in my medical decision making (see chart for details).   Emily Joyce is a 57 year old female with no significant medical history who presents to the ED with chest pain.  Patient with normal vitals.  No fever.  Patient with intermittent chest wall pain for the last 2 days.  Appears to be slightly worse with movement and palpation.  But not always according to the patient.  She has been doing some work at her house.  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  She does not have any cardiac risk factors.  No DVT or PE risk factors.  Wells criteria 0.  Doubt PE.  EKG shows sinus rhythm.  No signs of ischemic changes.  Chest x-ray showed no signs of pneumonia, no pneumothorax, no pleural effusion.  Patient with no significant anemia, electrolyte abnormality, kidney injury.  Troponin x2 within normal limits.  Doubt ACS.  Low hear score.  Possible musculoskeletal type pain however do recommend follow-up with primary care doctor to discuss possible need for stress test.  Understands this and was discharged from ED in good condition.  Told to return to ED if symptoms worsen.  This chart was dictated using voice recognition software.  Despite best efforts to proofread,  errors can occur which can change the documentation meaning.   Final Clinical Impressions(s) / ED Diagnoses   Final diagnoses:  Atypical chest pain    ED Discharge Orders    None       Virgina Norfolk, DO 11/01/17 1442

## 2017-11-01 NOTE — Telephone Encounter (Signed)
Phone call rec'd from pt.  C/o onset of intermittent left anterior chest pain yesterday, that lasted for 1-2 hr. intervals.  Stated it radiated into her left arm yesterday, but denied any radiation of the pain at this time.  This morning stated she felt the chest pain before she got up, and it had moved into the middle of her chest.  Reported it feels like "indigestion."  Also c/o feeling like she could burp intermittently.   Denied shortness of breath, nausea/ vomiting, or sweating.  c/o onset of dizziness this morning; "it feels like my head is full."  Denied any sinus congestion.  Advised to go to the ER at this time; advised that another adult should drive her.  Also advised that if she is experiencing worsening symptoms, ie: chest pain becomes worse, radiates to neck, jaw, left shoulder, shortness of breath, sweating or nausea, to call EMS.  Pt. verb. understanding; agrees to call her daughter to drive her to the ER.          Reason for Disposition . Chest pain lasts > 5 minutes (Exceptions: chest pain occurring > 3 days ago and now asymptomatic; same as previously diagnosed heartburn and has accompanying sour taste in mouth)  Answer Assessment - Initial Assessment Questions 1. LOCATION: "Where does it hurt?"       Left to mid chest  2. RADIATION: "Does the pain go anywhere else?" (e.g., into neck, jaw, arms, back)     Felt a little radiation to left arm but not today 3. ONSET: "When did the chest pain begin?" (Minutes, hours or days)      9/30 4. PATTERN "Does the pain come and go, or has it been constant since it started?"  "Does it get worse with exertion?"      Intermittent pain; lasting 1-2 hrs, then goes away 5. DURATION: "How long does it last" (e.g., seconds, minutes, hours)     See above 6. SEVERITY: "How bad is the pain?"  (e.g., Scale 1-10; mild, moderate, or severe)    - MILD (1-3): doesn't interfere with normal activities     - MODERATE (4-7): interferes with normal activities  or awakens from sleep    - SEVERE (8-10): excruciating pain, unable to do any normal activities       3/10 7. CARDIAC RISK FACTORS: "Do you have any history of heart problems or risk factors for heart disease?" (e.g., prior heart attack, angina; high blood pressure, diabetes, being overweight, high cholesterol, smoking, or strong family history of heart disease)     Denies DM, HTN, mild elevated cholesterol, no personal hx of heart disease; brother CABG at age 33  8. PULMONARY RISK FACTORS: "Do you have any history of lung disease?"  (e.g., blood clots in lung, asthma, emphysema, birth control pills)    Denied the above  9. CAUSE: "What do you think is causing the chest pain?"     unknown 10. OTHER SYMPTOMS: "Do you have any other symptoms?" (e.g., dizziness, nausea, vomiting, sweating, fever, difficulty breathing, cough)       Feeling a little dizzy, head feels a little full; denied SOB, N/V, or sweating 11. PREGNANCY: "Is there any chance you are pregnant?" "When was your last menstrual period?"       N/a  Protocols used: CHEST PAIN-A-AH

## 2017-11-03 ENCOUNTER — Encounter: Payer: Self-pay | Admitting: Family Medicine

## 2017-11-03 ENCOUNTER — Ambulatory Visit: Payer: BLUE CROSS/BLUE SHIELD | Admitting: Family Medicine

## 2017-11-03 VITALS — BP 112/78 | HR 65 | Temp 97.7°F | Ht 64.0 in | Wt 157.3 lb

## 2017-11-03 DIAGNOSIS — R0789 Other chest pain: Secondary | ICD-10-CM | POA: Diagnosis not present

## 2017-11-03 DIAGNOSIS — E349 Endocrine disorder, unspecified: Secondary | ICD-10-CM

## 2017-11-03 DIAGNOSIS — Z8249 Family history of ischemic heart disease and other diseases of the circulatory system: Secondary | ICD-10-CM

## 2017-11-03 NOTE — Patient Instructions (Signed)
BEFORE YOU LEAVE: -follow up: schedule CPE in 2-3 months in the morning and come fasting  -We placed a referral for you as discussed for the stress test. It usually takes about 1-2 weeks to process and schedule this referral. If you have not heard from [place] regarding this appointment in 2 weeks please contact our office.  -for the pain can use ice, over the counter pain medication as needed per instructions.  I hope you are feeling better soon! Seek care promptly if your symptoms worsen, new concerns arise or you are not improving with treatment.

## 2017-11-03 NOTE — Progress Notes (Signed)
HPI:  Using dictation device. Unfortunately this device frequently misinterprets words/phrases.  Acute visit for chest wall pain: -intermittent for about 1 week -worse with palpation and certain movements -had eval in emergency department 11/01/17 - per note neg CXR, EKG, labs, troponin x2 -beta hcg was elevated at 11, post menopausal -reports feels better today but still with reproducible chest pain when pushes L of sternum -no fevers, malaise, nausea, vomiting, gerd, resp symptoms except occ mild dyspnea  -reports brother had MI at age 42  ROS: See pertinent positives and negatives per HPI.  Past Medical History:  Diagnosis Date  . Hyperlipidemia     Past Surgical History:  Procedure Laterality Date  . BREAST BIOPSY     left    Family History  Problem Relation Age of Onset  . Cancer Mother        cancer  . Diabetes Mother   . Heart disease Father        CABG  . Diabetes Father   . Hypertension Father   . Dementia Father     SOCIAL HX: see hpi  No current outpatient medications on file.  Current Facility-Administered Medications:  .  betamethasone acetate-betamethasone sodium phosphate (CELESTONE) injection 3 mg, 3 mg, Intramuscular, Once, Evans, Kipp Brood M, DPM  EXAM:  Vitals:   11/03/17 0829  BP: 112/78  Pulse: 65  Temp: 97.7 F (36.5 C)  SpO2: 98%    Body mass index is 27 kg/m.  GENERAL: vitals reviewed and listed above, alert, oriented, appears well hydrated and in no acute distress  HEENT: atraumatic, conjunttiva clear, no obvious abnormalities on inspection of external nose and ears  NECK: no obvious masses on inspection  LUNGS: clear to auscultation bilaterally, no wheezes, rales or rhonchi, good air movement  CV: HRRR, no peripheral edema  MS: moves all extremities without noticeable abnormality, reproducible chest wall pain mid left parasternal region in the costal cartilage  PSYCH: pleasant and cooperative, no obvious depression or  anxiety  ASSESSMENT AND PLAN:  Discussed the following assessment and plan:  Chest wall pain - Plan: Exercise Tolerance Test  FH: heart disease - Plan: Exercise Tolerance Test  Elevated serum hCG  -we discussed possible serious and likely etiologies, workup and treatment, treatment risks and return precautions; reviewed labs from hospital -suspect chest wall pain most likely -after this discussion, Ruchi opted for: -Cardiac etiology less likely given symptoms, given her family history, to do an exercise stress test, orders were placed.  She is to seek care immediately if she has exertional symptoms, worsening of symptoms, breathing issues or other concerns. -discuss her labs, she has an elevated beta hCG, may be normal for postmenopausal state.  However, she will contact her gynecologist about this to see if further evaluation is needed.  -Symptomatic and supportive care for the chest wall pain -follow up advised for physical in 2 to 3 months, sooner as needed -of course, we advised Caelynn  to return or notify a doctor immediately if symptoms worsen or persist or new concerns arise.    Patient Instructions  BEFORE YOU LEAVE: -follow up: schedule CPE in 2-3 months in the morning and come fasting  -We placed a referral for you as discussed for the stress test. It usually takes about 1-2 weeks to process and schedule this referral. If you have not heard from [place] regarding this appointment in 2 weeks please contact our office.  -for the pain can use ice, over the counter pain medication as needed per  instructions.  I hope you are feeling better soon! Seek care promptly if your symptoms worsen, new concerns arise or you are not improving with treatment.     Terressa Koyanagi, DO

## 2017-11-07 DIAGNOSIS — Z780 Asymptomatic menopausal state: Secondary | ICD-10-CM | POA: Diagnosis not present

## 2017-11-22 ENCOUNTER — Ambulatory Visit (INDEPENDENT_AMBULATORY_CARE_PROVIDER_SITE_OTHER): Payer: BLUE CROSS/BLUE SHIELD

## 2017-11-22 DIAGNOSIS — R0789 Other chest pain: Secondary | ICD-10-CM

## 2017-11-22 DIAGNOSIS — Z8249 Family history of ischemic heart disease and other diseases of the circulatory system: Secondary | ICD-10-CM | POA: Diagnosis not present

## 2017-11-22 LAB — EXERCISE TOLERANCE TEST
Estimated workload: 10.1 METS
Exercise duration (min): 9 min
Exercise duration (sec): 0 s
MPHR: 163 {beats}/min
Peak HR: 162 {beats}/min
Percent HR: 99 %
RPE: 15
Rest HR: 73 {beats}/min

## 2018-01-12 DIAGNOSIS — Z113 Encounter for screening for infections with a predominantly sexual mode of transmission: Secondary | ICD-10-CM | POA: Diagnosis not present

## 2018-01-12 DIAGNOSIS — Z6825 Body mass index (BMI) 25.0-25.9, adult: Secondary | ICD-10-CM | POA: Diagnosis not present

## 2018-01-12 DIAGNOSIS — R7989 Other specified abnormal findings of blood chemistry: Secondary | ICD-10-CM | POA: Diagnosis not present

## 2018-01-12 DIAGNOSIS — Z124 Encounter for screening for malignant neoplasm of cervix: Secondary | ICD-10-CM | POA: Diagnosis not present

## 2018-01-12 DIAGNOSIS — Z01419 Encounter for gynecological examination (general) (routine) without abnormal findings: Secondary | ICD-10-CM | POA: Diagnosis not present

## 2018-01-12 DIAGNOSIS — Z1231 Encounter for screening mammogram for malignant neoplasm of breast: Secondary | ICD-10-CM | POA: Diagnosis not present

## 2018-01-12 LAB — HM MAMMOGRAPHY

## 2018-02-10 DIAGNOSIS — S0502XA Injury of conjunctiva and corneal abrasion without foreign body, left eye, initial encounter: Secondary | ICD-10-CM | POA: Diagnosis not present

## 2018-02-16 ENCOUNTER — Encounter: Payer: BLUE CROSS/BLUE SHIELD | Admitting: Family Medicine

## 2018-03-21 NOTE — Progress Notes (Signed)
HPI:  Using dictation device. Unfortunately this device frequently misinterprets words/phrases.  Here for CPE:  -Concerns and/or follow up today:  Wonders about options for colon cancer screening reports has had two normal colonosocpy screenings in [place] - last done at age 58. Told to do every 5 years as has FH colon cancer. She wants a virtual colonoscopy. She also has had a few episodes of dysphagia to solids. Upper esophagus. Has occurred more often the last few months. Denies gerd, heartburn. Does eat a lot of sweets. occasional irritation of roof of mouth. Hx elevated cholesterol and due for labs. Reports does mammo and women's health exams with gyn. Reports does skin exams with dermatology. Sees gynecologist, Dr. Dareen Piano, [facility] Sees Dernatologist Sees GI [facility] gastroenterology - [place] [place]  -Diet: variety of foods, balance and well rounded, larger portion sizes -Exercise: no regular exercise -Taking folic acid, vitamin D or calcium: no -Diabetes and Dyslipidemia Screening: fasting -Vaccines: see vaccine section EPIC -pap history: used to do with gyn, Dr. Mila Palmer - last abstracted pap pap done 11/2016 per gyn results -FDLMP: see nursing notes -sexual activity: not discussed, sees gyn -wants STI testing (Hep C if born [birth year]): no -FH breast, colon or ovarian ca: see FH Last mammogram: 11/2016 with Dr. Mila Palmer ob/gyn Last colon cancer screening: 2012 per gyn notes - due for repeat in 2017 per gyn, normal per pt, no report in EPIC Breast Ca Risk Assessment: see family history and pt history DEXA (>/= 65): n/a  -Alcohol, Tobacco, drug use: see social history  Review of Systems - no fevers, unintentional weight loss, vision loss, hearing loss, chest pain, sob, hemoptysis, melena, hematochezia, hematuria, genital discharge, changing or concerning skin lesions, bleeding, bruising, loc, thoughts of self harm or SI  Past Medical History:   Diagnosis Date  . Hyperlipidemia     Past Surgical History:  Procedure Laterality Date  . BREAST BIOPSY     left    Family History  Problem Relation Age of Onset  . Cancer Mother        cancer  . Diabetes Mother   . Heart disease Father        CABG  . Diabetes Father   . Hypertension Father   . Dementia Father     Social History   Socioeconomic History  . Marital status: Married    Spouse name: Not on file  . Number of children: Not on file  . Years of education: Not on file  . Highest education level: Not on file  Occupational History  . Not on file  Social Needs  . Financial resource strain: Not on file  . Food insecurity:    Worry: Not on file    Inability: Not on file  . Transportation needs:    Medical: Not on file    Non-medical: Not on file  Tobacco Use  . Smoking status: Never Smoker  . Smokeless tobacco: Never Used  Substance and Sexual Activity  . Alcohol use: Yes  . Drug use: No  . Sexual activity: Not on file  Lifestyle  . Physical activity:    Days per week: Not on file    Minutes per session: Not on file  . Stress: Not on file  Relationships  . Social connections:    Talks on phone: Not on file    Gets together: Not on file    Attends religious service: Not on file    Active member of club or organization:  Not on file    Attends meetings of clubs or organizations: Not on file    Relationship status: Not on file  Other Topics Concern  . 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  Current Facility-Administered Medications:  .  betamethasone acetate-betamethasone sodium phosphate (CELESTONE) injection 3 mg, 3 mg, Intramuscular, Once, Evans, Larena Glassman, DPM  EXAM:  Vitals:   03/23/18 0909  BP: 116/78  Pulse: 70  Temp:  98 F (36.7 C)    GENERAL: vitals reviewed and listed below, alert, oriented, appears well hydrated and in no acute distress  HEENT: head atraumatic, PERRLA, normal appearance of eyes, ears, nose and mouth. moist mucus membranes. Normal exam of oropharynx.  NECK: supple, no masses or lymphadenopathy  LUNGS: clear to auscultation bilaterally, no rales, rhonchi or wheeze  CV: HRRR, no peripheral edema or cyanosis, normal pedal pulses  ABDOMEN: bowel sounds normal, soft, non tender to palpation, no masses, no rebound or guarding  GU/BREAST: declined, sees gyn  SKIN: no rash or abnormal lesions, declined full skin exam, sees dermatology  MS: normal gait, moves all extremities normally  NEURO: normal gait, speech and thought processing grossly intact, muscle tone grossly intact throughout  PSYCH: normal affect, pleasant and cooperative  ASSESSMENT AND PLAN:  Discussed the following assessment and plan:  PREVENTIVE EXAM: -Discussed and advised all [place] preventive services health task force level A and B recommendations for age, sex and risks. -Advised at least 150 minutes of exercise per week and a healthy diet with avoidance of (less then 1 serving per week) processed foods, white starches, red meat, fast foods and sweets and consisting of: * 5-9 servings of fresh fruits and vegetables (not corn or potatoes) *nuts and seeds, beans *olives and olive oil *lean meats such as fish and white chicken  *whole grains -labs, studies and vaccines per orders this encounter -she declined recommended vaccines  2. Mucosal irritation of oral cavity-discussed common and serious potential etiologies. No abnormality on exam today. Admits to high sugar intake. Check labs, GI referral, trial lowering sugar intake. Follow up with dentist or here if recurs.  3. Dysphagia, unspecified type -discussed potential etiologies and advise specialist eval - Ambulatory referral to Gastroenterology  4. Colon  cancer screening - Ambulatory referral to Gastroenterology - I am not sure of options for virtual colonoscopy or other, also it is unclear when next due - she reports told 5 years, but normal colonoscopy. Advised assistant to obtain report as I can not find it in the chart.  5. Hyperlipidemia, unspecified hyperlipidemia type -lifestyle recs - Lipid panel  6. Low serum vitamin B12 -b12 on low end in the past, given oral issues will check - CBC - Vitamin B12  7. Hyperglycemia -blood Glu a little up on ER labs in the past, high sugar intake - Hemoglobin A1c   Patient advised to return to clinic immediately if symptoms worsen or persist or new concerns.  There are no Patient Instructions on file for this visit.  No follow-ups on file.  Terressa Koyanagi, DO

## 2018-03-23 ENCOUNTER — Encounter: Payer: Self-pay | Admitting: Family Medicine

## 2018-03-23 ENCOUNTER — Ambulatory Visit (INDEPENDENT_AMBULATORY_CARE_PROVIDER_SITE_OTHER): Payer: BLUE CROSS/BLUE SHIELD | Admitting: Family Medicine

## 2018-03-23 VITALS — BP 116/78 | HR 70 | Temp 98.0°F | Ht 65.0 in | Wt 156.4 lb

## 2018-03-23 DIAGNOSIS — Z1211 Encounter for screening for malignant neoplasm of colon: Secondary | ICD-10-CM

## 2018-03-23 DIAGNOSIS — R1310 Dysphagia, unspecified: Secondary | ICD-10-CM | POA: Diagnosis not present

## 2018-03-23 DIAGNOSIS — E785 Hyperlipidemia, unspecified: Secondary | ICD-10-CM | POA: Diagnosis not present

## 2018-03-23 DIAGNOSIS — Z0000 Encounter for general adult medical examination without abnormal findings: Secondary | ICD-10-CM

## 2018-03-23 DIAGNOSIS — R739 Hyperglycemia, unspecified: Secondary | ICD-10-CM

## 2018-03-23 DIAGNOSIS — R198 Other specified symptoms and signs involving the digestive system and abdomen: Secondary | ICD-10-CM | POA: Diagnosis not present

## 2018-03-23 DIAGNOSIS — K1379 Other lesions of oral mucosa: Secondary | ICD-10-CM

## 2018-03-23 DIAGNOSIS — E538 Deficiency of other specified B group vitamins: Secondary | ICD-10-CM | POA: Diagnosis not present

## 2018-03-23 LAB — CBC
HCT: 38.7 % (ref 36.0–46.0)
Hemoglobin: 13 g/dL (ref 12.0–15.0)
MCHC: 33.6 g/dL (ref 30.0–36.0)
MCV: 89.8 fl (ref 78.0–100.0)
Platelets: 245 10*3/uL (ref 150.0–400.0)
RBC: 4.31 Mil/uL (ref 3.87–5.11)
RDW: 13.1 % (ref 11.5–15.5)
WBC: 5.8 10*3/uL (ref 4.0–10.5)

## 2018-03-23 LAB — LIPID PANEL
Cholesterol: 247 mg/dL — ABNORMAL HIGH (ref 0–200)
HDL: 63.6 mg/dL (ref >39.00)
LDL Cholesterol: 155 mg/dL — ABNORMAL HIGH (ref 0–99)
NonHDL: 183.22
Total CHOL/HDL Ratio: 4
Triglycerides: 139 mg/dL (ref 0.0–149.0)
VLDL: 27.8 mg/dL (ref 0.0–40.0)

## 2018-03-23 LAB — VITAMIN B12: Vitamin B-12: 244 pg/mL (ref 211–911)

## 2018-03-23 LAB — HEMOGLOBIN A1C: Hgb A1c MFr Bld: 5.7 % (ref 4.6–6.5)

## 2018-03-23 NOTE — Patient Instructions (Signed)
BEFORE YOU LEAVE: -labs -obtain and update colonoscopy if needed -obtain and update mammogram if needed -follow up: yearly for physical and as needed  -We placed a referral for you as discussed to the gastroenterologist. It usually takes about 1-2 weeks to process and schedule this referral. If you have not heard from [place] regarding this appointment in 2 weeks please contact our office.  Trial low sugar diet and see dentist if you notice irritation of mouth persists.  We have ordered labs or studies at this visit. It can take up to 1-2 weeks for results and processing. IF results require follow up or explanation, we will call you with instructions. Clinically stable results will be released to your [facility]. If you have not heard from [place] or cannot find your results in [facility] in 2 weeks please contact our office at [phone].  If you are not yet signed up for [facility], please consider signing up.   Preventive Care 40-64 Years, Female Preventive care refers to lifestyle choices and visits with your health care provider that can promote health and wellness. What does preventive care include?   A yearly physical exam. This is also called an annual well check.  Dental exams once or twice a year.  Routine eye exams. Ask your health care provider how often you should have your eyes checked.  Personal lifestyle choices, including: ? Daily care of your teeth and gums. ? Regular physical activity. ? Eating a healthy diet. ? Avoiding tobacco and drug use. ? Limiting alcohol use. ? Practicing safe sex. ? Taking vitamin and mineral supplements as recommended by your health care provider. What happens during an annual well check? The services and screenings done by your health care provider during your annual well check will depend on your age, overall health, lifestyle risk factors, and family history of disease. Counseling Your health care provider may ask you questions about your:  Alcohol  use.  Tobacco use.  Drug use.  Emotional well-being.  Home and relationship well-being.  Sexual activity.  Eating habits.  Work and work Statistician.  Method of birth control.  Menstrual cycle.  Pregnancy history. Screening You may have the following tests or measurements:  Height, weight, and BMI.  Blood pressure.  Lipid and cholesterol levels. These may be checked every 5 years, or more frequently if you are over 12 years old.  Skin check.  Lung cancer screening. You may have this screening every year starting at age 79 if you have a 30-pack-year history of smoking and currently smoke or have quit within the past 15 years.  Colorectal cancer screening. All adults should have this screening starting at age 28 and continuing until age 73. Your health care provider may recommend screening at age 87. You will have tests every 1-10 years, depending on your results and the type of screening test. People at increased risk should start screening at an earlier age. Screening tests may include: ? Guaiac-based fecal occult blood testing. ? Fecal immunochemical test (FIT). ? Stool DNA test. ? Virtual colonoscopy. ? Sigmoidoscopy. During this test, a flexible tube with a tiny camera (sigmoidoscope) is used to examine your rectum and lower colon. The sigmoidoscope is inserted through your anus into your rectum and lower colon. ? Colonoscopy. During this test, a long, thin, flexible tube with a tiny camera (colonoscope) is used to examine your entire colon and rectum.  Hepatitis C blood test.  Hepatitis B blood test.  Sexually transmitted disease (STD) testing.  Diabetes screening.  This is done by checking your blood sugar (glucose) after you have not eaten for a while (fasting). You may have this done every 1-3 years.  Mammogram. This may be done every 1-2 years. Talk to your health care provider about when you should start having regular mammograms. This may depend on whether  you have a family history of breast cancer.  BRCA-related cancer screening. This may be done if you have a family history of breast, ovarian, tubal, or peritoneal cancers.  Pelvic exam and Pap test. This may be done every 3 years starting at age 32. Starting at age 73, this may be done every 5 years if you have a Pap test in combination with an HPV test.  Bone density scan. This is done to screen for osteoporosis. You may have this scan if you are at high risk for osteoporosis. Discuss your test results, treatment options, and if necessary, the need for more tests with your health care provider. Vaccines Your health care provider may recommend certain vaccines, such as:  Influenza vaccine. This is recommended every year.  Tetanus, diphtheria, and acellular pertussis (Tdap, Td) vaccine. You may need a Td booster every 10 years.  Varicella vaccine. You may need this if you have not been vaccinated.  Zoster vaccine. You may need this after age 95.  Measles, mumps, and rubella (MMR) vaccine. You may need at least one dose of MMR if you were born in 1957 or later. You may also need a second dose.  Pneumococcal 13-valent conjugate (PCV13) vaccine. You may need this if you have certain conditions and were not previously vaccinated.  Pneumococcal polysaccharide (PPSV23) vaccine. You may need one or two doses if you smoke cigarettes or if you have certain conditions.  Meningococcal vaccine. You may need this if you have certain conditions.  Hepatitis A vaccine. You may need this if you have certain conditions or if you travel or work in places where you may be exposed to hepatitis A.  Hepatitis B vaccine. You may need this if you have certain conditions or if you travel or work in places where you may be exposed to hepatitis B.  Haemophilus influenzae type b (Hib) vaccine. You may need this if you have certain conditions. Talk to your health care provider about which screenings and vaccines  you need and how often you need them. This information is not intended to replace advice given to you by your health care provider. Make sure you discuss any questions you have with your health care provider. Document Released: 02/14/2015 Document Revised: 03/10/2017 Document Reviewed: 11/19/2014 Elsevier Interactive Patient Education  2019 [employer].

## 2018-03-27 ENCOUNTER — Encounter: Payer: Self-pay | Admitting: Family Medicine

## 2018-04-05 ENCOUNTER — Telehealth: Payer: Self-pay

## 2018-04-05 NOTE — Telephone Encounter (Signed)
Copied from [id number]. Topic: Referral - Request for Referral >> Apr 05, 2018 10:33 AM Baldo Daub L wrote: Has patient seen PCP for this complaint? yes *If NO, is insurance requiring patient see PCP for this issue before PCP can refer them? Referral for which specialty: imaging Preferred provider/office: [facility] Reason for referral: virtual colonoscopy  Pt can be reached at [phone]

## 2018-04-06 NOTE — Telephone Encounter (Signed)
I do not order this test either and would not recommend it without her first consulting with a gastroenterologist on options for colon cancer screening,  limitations and insurance coverage as this is not a test I am familiar with using, interpreting or processing.

## 2018-04-06 NOTE — Telephone Encounter (Signed)
I called the pt and informed her of the message below.  She stated she spoke with the GI office and they advised her to call her PCP as they do not order this test.  Message sent to Dr Selena Batten.

## 2018-04-06 NOTE — Telephone Encounter (Signed)
I called the pt and informed her of the message below.  She was given the phone number to call [facility].

## 2018-04-06 NOTE — Telephone Encounter (Signed)
Would need a consult with GI to discuss this option. Ok to refer to GI. Thanks.

## 2018-07-14 DIAGNOSIS — Z20828 Contact with and (suspected) exposure to other viral communicable diseases: Secondary | ICD-10-CM | POA: Diagnosis not present

## 2018-08-15 DIAGNOSIS — Z6826 Body mass index (BMI) 26.0-26.9, adult: Secondary | ICD-10-CM | POA: Diagnosis not present

## 2018-08-15 DIAGNOSIS — R891 Abnormal level of hormones in specimens from other organs, systems and tissues: Secondary | ICD-10-CM | POA: Diagnosis not present

## 2018-09-15 ENCOUNTER — Encounter: Payer: Self-pay | Admitting: Family Medicine

## 2018-11-03 DIAGNOSIS — D225 Melanocytic nevi of trunk: Secondary | ICD-10-CM | POA: Diagnosis not present

## 2018-11-03 DIAGNOSIS — L814 Other melanin hyperpigmentation: Secondary | ICD-10-CM | POA: Diagnosis not present

## 2018-11-03 DIAGNOSIS — D2262 Melanocytic nevi of left upper limb, including shoulder: Secondary | ICD-10-CM | POA: Diagnosis not present

## 2018-11-03 DIAGNOSIS — Z85828 Personal history of other malignant neoplasm of skin: Secondary | ICD-10-CM | POA: Diagnosis not present

## 2018-11-23 DIAGNOSIS — Z23 Encounter for immunization: Secondary | ICD-10-CM | POA: Diagnosis not present

## 2018-11-23 DIAGNOSIS — R739 Hyperglycemia, unspecified: Secondary | ICD-10-CM | POA: Diagnosis not present

## 2018-11-23 DIAGNOSIS — E7800 Pure hypercholesterolemia, unspecified: Secondary | ICD-10-CM | POA: Diagnosis not present

## 2018-11-23 DIAGNOSIS — E7801 Familial hypercholesterolemia: Secondary | ICD-10-CM | POA: Diagnosis not present

## 2018-11-23 DIAGNOSIS — Z0000 Encounter for general adult medical examination without abnormal findings: Secondary | ICD-10-CM | POA: Diagnosis not present

## 2018-11-23 DIAGNOSIS — Z1159 Encounter for screening for other viral diseases: Secondary | ICD-10-CM | POA: Diagnosis not present

## 2019-04-26 DIAGNOSIS — H5711 Ocular pain, right eye: Secondary | ICD-10-CM | POA: Diagnosis not present

## 2019-05-16 DIAGNOSIS — Z124 Encounter for screening for malignant neoplasm of cervix: Secondary | ICD-10-CM | POA: Diagnosis not present

## 2019-05-16 DIAGNOSIS — Z6827 Body mass index (BMI) 27.0-27.9, adult: Secondary | ICD-10-CM | POA: Diagnosis not present

## 2019-05-16 DIAGNOSIS — Z1231 Encounter for screening mammogram for malignant neoplasm of breast: Secondary | ICD-10-CM | POA: Diagnosis not present

## 2019-05-16 DIAGNOSIS — R635 Abnormal weight gain: Secondary | ICD-10-CM | POA: Diagnosis not present

## 2019-05-16 DIAGNOSIS — Z01419 Encounter for gynecological examination (general) (routine) without abnormal findings: Secondary | ICD-10-CM | POA: Diagnosis not present

## 2019-05-25 DIAGNOSIS — E039 Hypothyroidism, unspecified: Secondary | ICD-10-CM | POA: Diagnosis not present

## 2019-05-25 DIAGNOSIS — R7301 Impaired fasting glucose: Secondary | ICD-10-CM | POA: Diagnosis not present

## 2019-05-25 DIAGNOSIS — E7800 Pure hypercholesterolemia, unspecified: Secondary | ICD-10-CM | POA: Diagnosis not present

## 2019-10-18 IMAGING — CR DG CHEST 2V
2 series · 2 of 2 positions shown · non-contrast
Comparison: 04/11/2015.

CLINICAL DATA: Chest pain.  Dizziness and weakness.

EXAM:
CHEST - 2 VIEW

[chest pa]
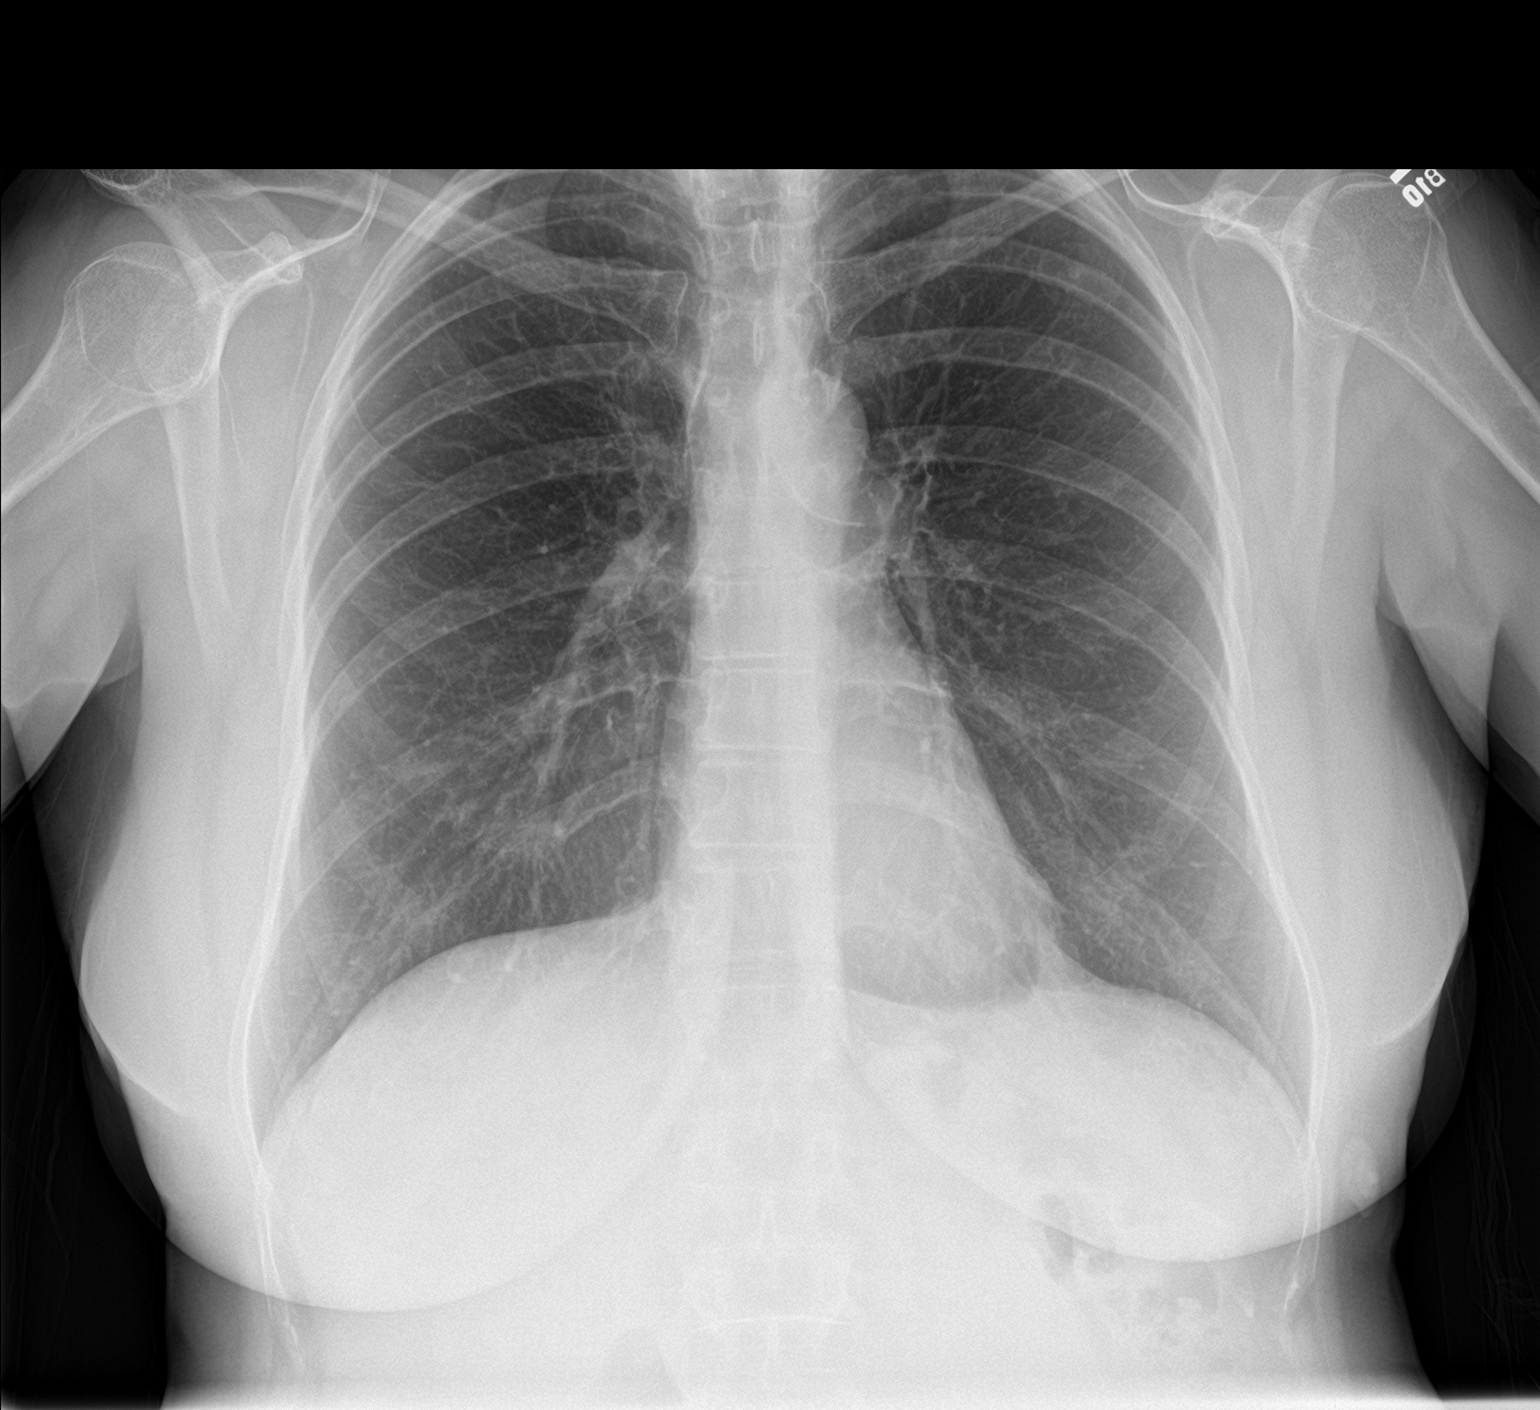

[chest lat]
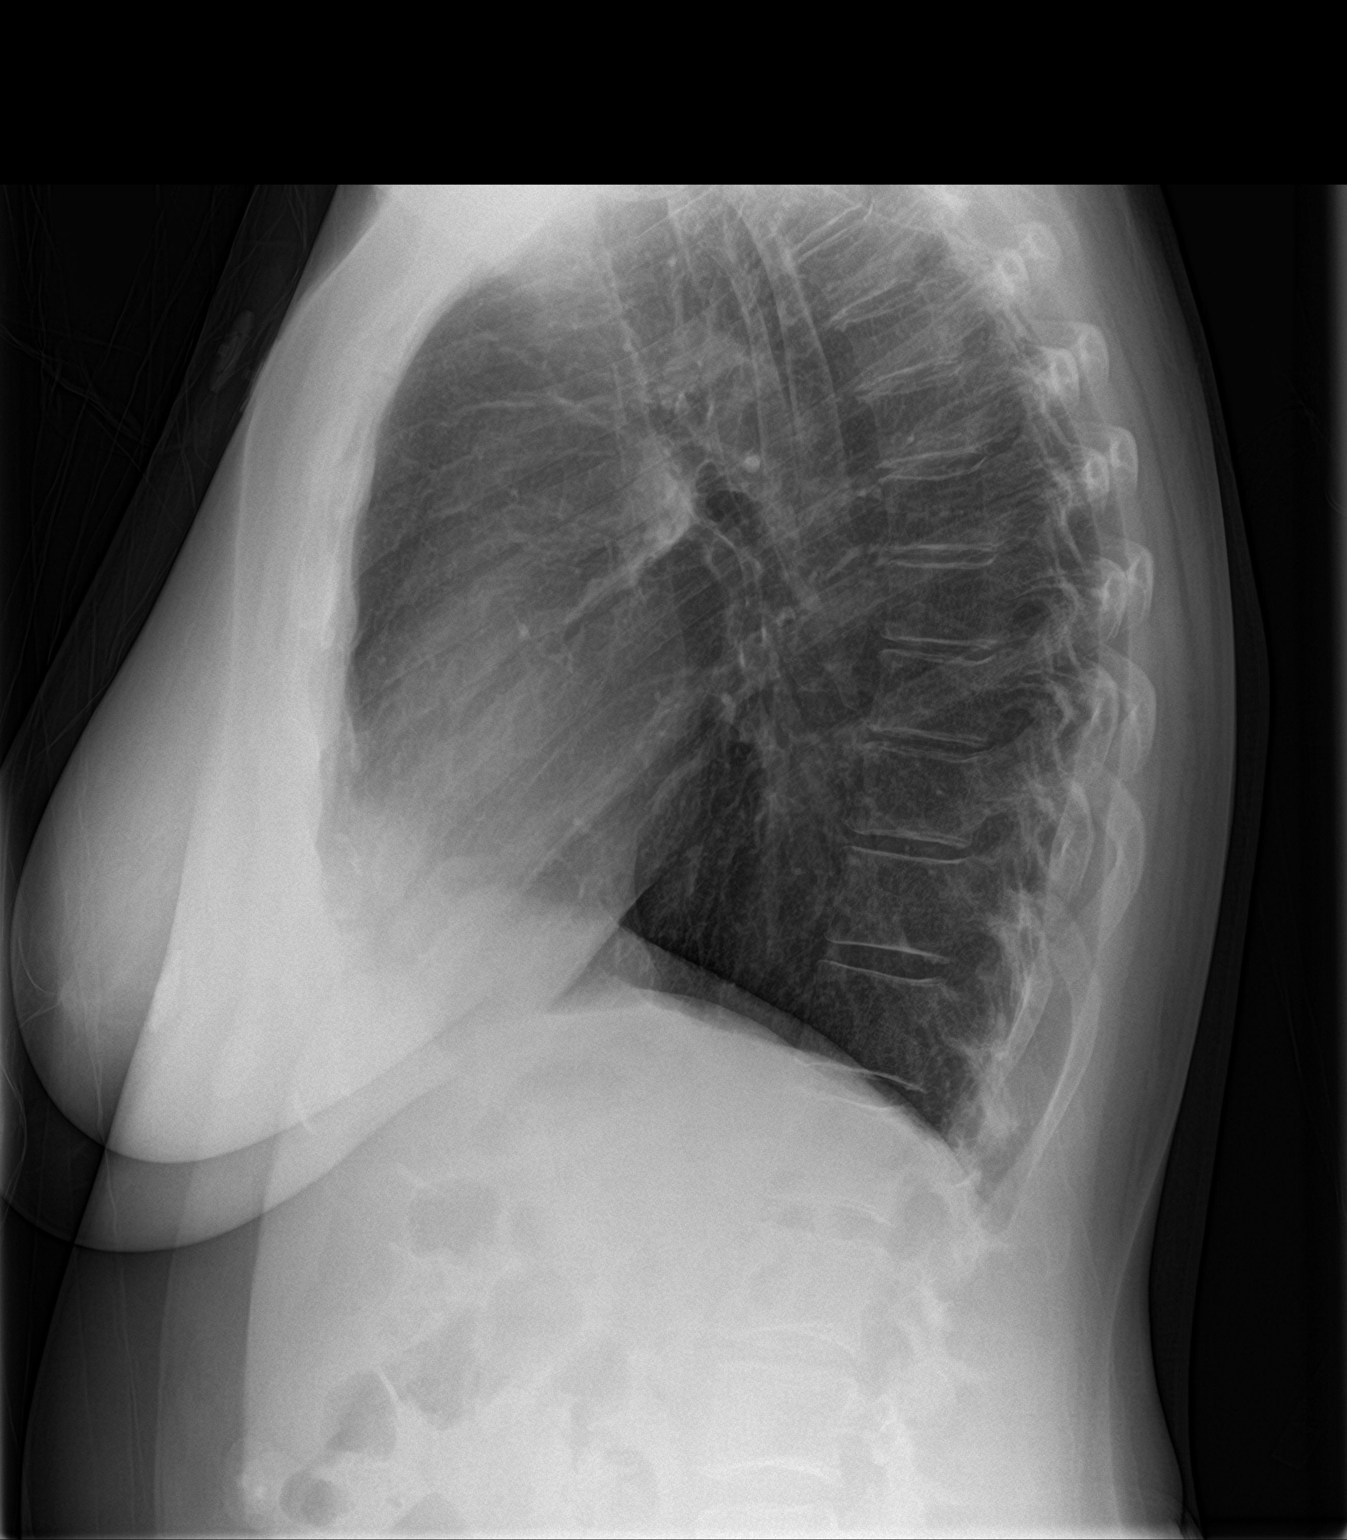

[2 of 2 positions shown; findings below may reference images not displayed]

FINDINGS: Mediastinum hilar structures normal. Mild left base subsegmental
atelectasis and or scarring. No pleural effusion or pneumothorax. No
acute bony abnormality
IMPRESSION: Mild left base subsegmental atelectasis and or scarring.

## 2019-11-07 DIAGNOSIS — Z85828 Personal history of other malignant neoplasm of skin: Secondary | ICD-10-CM | POA: Diagnosis not present

## 2019-11-07 DIAGNOSIS — L814 Other melanin hyperpigmentation: Secondary | ICD-10-CM | POA: Diagnosis not present

## 2019-11-07 DIAGNOSIS — L821 Other seborrheic keratosis: Secondary | ICD-10-CM | POA: Diagnosis not present

## 2019-11-07 DIAGNOSIS — D225 Melanocytic nevi of trunk: Secondary | ICD-10-CM | POA: Diagnosis not present

## 2019-11-27 DIAGNOSIS — E039 Hypothyroidism, unspecified: Secondary | ICD-10-CM | POA: Diagnosis not present

## 2019-11-27 DIAGNOSIS — E7800 Pure hypercholesterolemia, unspecified: Secondary | ICD-10-CM | POA: Diagnosis not present

## 2019-11-27 DIAGNOSIS — Z0000 Encounter for general adult medical examination without abnormal findings: Secondary | ICD-10-CM | POA: Diagnosis not present

## 2019-11-27 DIAGNOSIS — R7301 Impaired fasting glucose: Secondary | ICD-10-CM | POA: Diagnosis not present

## 2020-01-04 DIAGNOSIS — E039 Hypothyroidism, unspecified: Secondary | ICD-10-CM | POA: Diagnosis not present

## 2020-01-04 DIAGNOSIS — R7301 Impaired fasting glucose: Secondary | ICD-10-CM | POA: Diagnosis not present

## 2020-01-04 DIAGNOSIS — E7800 Pure hypercholesterolemia, unspecified: Secondary | ICD-10-CM | POA: Diagnosis not present

## 2020-01-09 ENCOUNTER — Other Ambulatory Visit: Payer: Self-pay | Admitting: Internal Medicine

## 2020-01-09 DIAGNOSIS — E7800 Pure hypercholesterolemia, unspecified: Secondary | ICD-10-CM

## 2020-02-07 ENCOUNTER — Other Ambulatory Visit: Payer: Self-pay

## 2020-02-07 ENCOUNTER — Ambulatory Visit
Admission: RE | Admit: 2020-02-07 | Discharge: 2020-02-07 | Disposition: A | Discharge status: Home / Self Care | Payer: Self-pay | Source: Ambulatory Visit | Attending: Internal Medicine | Admitting: Internal Medicine

## 2020-02-07 DIAGNOSIS — E7800 Pure hypercholesterolemia, unspecified: Secondary | ICD-10-CM | POA: Diagnosis not present

## 2020-04-22 ENCOUNTER — Ambulatory Visit: Payer: BC Managed Care – PPO | Admitting: Cardiology

## 2020-04-22 ENCOUNTER — Encounter: Payer: Self-pay | Admitting: Cardiology

## 2020-04-22 ENCOUNTER — Other Ambulatory Visit: Payer: Self-pay

## 2020-04-22 VITALS — BP 108/70 | HR 65 | Ht 65.0 in | Wt 153.0 lb

## 2020-04-22 DIAGNOSIS — E038 Other specified hypothyroidism: Secondary | ICD-10-CM | POA: Diagnosis not present

## 2020-04-22 DIAGNOSIS — R931 Abnormal findings on diagnostic imaging of heart and coronary circulation: Secondary | ICD-10-CM

## 2020-04-22 DIAGNOSIS — E7800 Pure hypercholesterolemia, unspecified: Secondary | ICD-10-CM | POA: Diagnosis not present

## 2020-04-22 MED ORDER — ATORVASTATIN CALCIUM 10 MG PO TABS: 10.0000 mg | ORAL_TABLET | Freq: Every day | ORAL | 3 refills | Status: DC

## 2020-04-22 NOTE — Patient Instructions (Signed)
Medication Instructions:  Your physician has recommended you make the following change in your medication:  1) START taking Lipitor (atorvastatin) 10 mg daily  *If you need a refill on your cardiac medications before your next appointment, please call your pharmacy*   Lab Work: Fasting lipids and ALT in 6 weeks If you have labs (blood work) drawn today and your tests are completely normal, you will receive your results only by: Marland Kitchen MyChart Message (if you have MyChart) OR . A paper copy in the mail If you have any lab test that is abnormal or we need to change your treatment, we will call you to review the results.   Follow-Up: At [facility], you and your health needs are our priority.  As part of our continuing mission to provide you with exceptional heart care, we have created designated Provider Care Teams.  These Care Teams include your primary Cardiologist (physician) and Advanced Practice Providers (APPs -  Physician Assistants and Nurse Practitioners) who all work together to provide you with the care you need, when you need it.  Follow up with Dr. Mayford Knife as needed.

## 2020-04-22 NOTE — Progress Notes (Signed)
Cardiology Consult Note    Date:  04/22/2020   ID:  Emily Joyce, DOB 12/30/1960, MRN [id number]  PCP:  Emily Funk, MD  Cardiologist:  Emily Magic, MD   Chief Complaint  Patient presents with  . New Patient (Initial Visit)    Coronary artery calcifications    History of Present Illness:  Emily Joyce is a 60 y.o. female who is being seen today for the evaluation of coronary artery calcifications at the request of Emily Funk, MD.  This is a 60yo female with a hx of CVA, HLD and HTN who was seen by her PCP.  Due to HLD and a fm hx of CAD, a coronary Ca score was obtained which was elevated at 164 with aortic atherosclerosis.  Her LDL was 122 on last check. She is now referred for further evaluation.   She is here today for followup and is doing well.  She denies any chest pain or pressure, SOB, DOE, PND, orthopnea, LE edema, dizziness, palpitations or syncope. She is compliant with her meds and is tolerating meds with no SE.    Past Medical History:  Diagnosis Date  . Female cystocele   . Herpes zoster 2015   abdomen  . Hypercholesteremia   . Impaired fasting glucose   . Prediabetes   . Subclinical hypothyroidism     Past Surgical History:  Procedure Laterality Date  . BASAL CELL CARCINOMA EXCISION  2019   face  . BREAST BIOPSY     left    Current Medications: No outpatient medications have been marked as taking for the 04/22/20 encounter (Office Visit) with Quintella Reichert, MD.    Allergies:   Patient has no known allergies.   Social History   Socioeconomic History  . Marital status: Married    Spouse name: Not on file  . Number of children: Not on file  . Years of education: Not on file  . Highest education level: Not on file  Occupational History  . Not on file  Tobacco Use  . Smoking status: Never Smoker  . Smokeless tobacco: Never Used  Substance and Sexual Activity  . Alcohol use: Yes  . Drug use: No  . Sexual activity: Not on file   Other Topics Concern  . Not on file  Social History Narrative   Work or School: retired Publishing rights manager Situation: lives with husband      Spiritual Beliefs: Christian      Lifestyle: no regular exercise; diet is good            Social Determinants of Corporate investment banker Strain: Not on file  Food Insecurity: Not on file  Transportation Needs: Not on file  Physical Activity: Not on file  Stress: Not on file  Social Connections: Not on file     Family History:  The patient's family history includes CAD in her brother and mother; Cancer - Colon in her maternal grandmother; Dementia in her father; Diabetes in her father; High Cholesterol in her mother; Hypertension in her father; Hypothyroidism in her mother; Other in her brother.   ROS:   Please see the history of present illness.    ROS All other systems reviewed and are negative.  No flowsheet data found.   PHYSICAL EXAM:   VS:  BP 108/70   Pulse 65   Ht 5\' 5"  (1.651 m)   Wt 153 lb (69.4 kg)   BMI 25.46 kg/m  GEN: Well nourished, well developed, in no acute distress  HEENT: normal  Neck: no JVD, carotid bruits, or masses Cardiac: RRR; no murmurs, rubs, or gallops,no edema.  Intact distal pulses bilaterally.  Respiratory:  clear to auscultation bilaterally, normal work of breathing GI: soft, nontender, nondistended, + BS MS: no deformity or atrophy  Skin: warm and dry, no rash Neuro:  Alert and Oriented x 3, Strength and sensation are intact Psych: euthymic mood, full affect  Wt Readings from Last 3 Encounters:  04/22/20 153 lb (69.4 kg)  03/23/18 156 lb 6.4 oz (70.9 kg)  11/03/17 157 lb 4.8 oz (71.4 kg)      Studies/Labs Reviewed:   EKG:  EKG is ordered today.  The ekg ordered today demonstrates NSR with no ST changes  Coronary Ca score IMPRESSION: 1. Coronary artery calcium score of 164. This places the patient in the 93rd percentile for age, gender and race/ethnicity. 2. Aortic  atherosclerosis.  Aortic Atherosclerosis (ICD10-I70.0).  Recent Labs: No results found for requested labs within last 8760 hours.   Lipid Panel    Component Value Date/Time   CHOL 247 (H) 03/23/2018 0948   TRIG 139.0 03/23/2018 0948   HDL 63.60 03/23/2018 0948   CHOLHDL 4 03/23/2018 0948   VLDL 27.8 03/23/2018 0948   LDLCALC 155 (H) 03/23/2018 0948   LDLDIRECT 159.7 03/03/2010 0849    ASSESSMENT:    1. Agatston coronary artery calcium score between 200 and 399   2. Pure hypercholesterolemia   3. Subclinical hypothyroidism      PLAN:  In order of problems listed above:  1.  Coronary artery calcifications -she has an elevated score of 164 which places her in the 93rd percentile for age and sex matched controls -she is completely asymptomatic -she denies any hx of tobacco use and her mom had CAD dx late in life but her brother had CAD dx at 20 -her EKG is normal -she needs tight control of her HLD and BS -since she is asymptomatic and Ca score < 400 no further ischemic workup indicated at this time -she knows to contact me if she develops chest pain, SOB or exertional fatigue  2.  HLD -LDL goal < 70 -recent LDL was 132 and HLD 60 -recommend starting Lipitor 10mg  daily and repeat FLP and ALT in 6 weeks  3.  Subclinical Hypothyroidism -her TSH was minimally elevated at 4.6 (cutoff 4.5) and therefore PCP should consider treatment which may help with HLD    Medication Adjustments/Labs and Tests Ordered: Current medicines are reviewed at length with the patient today.  Concerns regarding medicines are outlined above.  Medication changes, Labs and Tests ordered today are listed in the Patient Instructions below.  There are no Patient Instructions on file for this visit.   Signed, , MD  04/22/2020 2:13 PM    [facility] [address] [place], [place], [place]  [place] Phone: [phone]; Fax: [phone]

## 2020-04-22 NOTE — Addendum Note (Signed)
Addended by: Theresia Majors on: 04/22/2020 02:18 PM   Modules accepted: Orders

## 2020-06-04 ENCOUNTER — Other Ambulatory Visit: Payer: BC Managed Care – PPO

## 2020-06-13 DIAGNOSIS — Z01419 Encounter for gynecological examination (general) (routine) without abnormal findings: Secondary | ICD-10-CM | POA: Diagnosis not present

## 2020-06-13 DIAGNOSIS — Z1231 Encounter for screening mammogram for malignant neoplasm of breast: Secondary | ICD-10-CM | POA: Diagnosis not present

## 2020-06-18 ENCOUNTER — Other Ambulatory Visit: Payer: BC Managed Care – PPO

## 2020-07-01 DIAGNOSIS — M6281 Muscle weakness (generalized): Secondary | ICD-10-CM | POA: Diagnosis not present

## 2020-07-01 DIAGNOSIS — R350 Frequency of micturition: Secondary | ICD-10-CM | POA: Diagnosis not present

## 2020-07-01 DIAGNOSIS — N8110 Cystocele, unspecified: Secondary | ICD-10-CM | POA: Diagnosis not present

## 2020-07-09 DIAGNOSIS — H52223 Regular astigmatism, bilateral: Secondary | ICD-10-CM | POA: Diagnosis not present

## 2020-07-28 ENCOUNTER — Encounter: Payer: Self-pay | Admitting: *Deleted

## 2020-07-28 ENCOUNTER — Telehealth: Payer: Self-pay | Admitting: Cardiology

## 2020-07-28 DIAGNOSIS — R002 Palpitations: Secondary | ICD-10-CM

## 2020-07-28 NOTE — Progress Notes (Signed)
Patient ID: Emily Joyce, female   DOB: 04/15/1960, 60 y.o.   MRN: [id number] Patient enrolled for [employer] to ship a 30 day cardiac event monitor to [address], [place], [place]  [address], per patient's request.

## 2020-07-28 NOTE — Telephone Encounter (Signed)
Returned call to Pt.  Pt states over the last week-off and on- she had felt "different".  States a feeling of "fluttering" in her chest- denies that it is palpitations-states a feeling of "uneasiness" in her chest.  States she is not under any more stress.  States she has checked her BP and it is "ok".  Also checked on EKG on her mother's kardia device which was also "ok".  States she has not been taking the recommended cholesterol medication.  States she was "trying on her own".  Advised would forward to Dr. Mayford Knife for further advisement.

## 2020-07-28 NOTE — Telephone Encounter (Signed)
Spoke with the patient who denies any chest pain or pressure. Advised patient that Dr. Mayford Knife would like for her to wear a 4 week heart monitor. Patient verbalized understanding.

## 2020-07-28 NOTE — Telephone Encounter (Signed)
Patient c/o Palpitations:  High priority if patient c/o lightheadedness, shortness of breath, or chest pain  How long have you had palpitations/irregular HR/ Afib? Are you having the symptoms now? 1 week; having fluttering  Are you currently experiencing lightheadedness, SOB or CP? A little SOB  Do you have a history of afib (atrial fibrillation) or irregular heart rhythm? No   Have you checked your BP or HR? (document readings if available): yes   Are you experiencing any other symptoms? Supposed to be taking chloresterol medication

## 2020-08-29 ENCOUNTER — Other Ambulatory Visit: Payer: BC Managed Care – PPO

## 2020-10-08 NOTE — Progress Notes (Signed)
Monitor was never applied. Order will be cancelled 

## 2020-11-07 DIAGNOSIS — D225 Melanocytic nevi of trunk: Secondary | ICD-10-CM | POA: Diagnosis not present

## 2020-11-07 DIAGNOSIS — D485 Neoplasm of uncertain behavior of skin: Secondary | ICD-10-CM | POA: Diagnosis not present

## 2020-11-07 DIAGNOSIS — L814 Other melanin hyperpigmentation: Secondary | ICD-10-CM | POA: Diagnosis not present

## 2020-11-07 DIAGNOSIS — Z85828 Personal history of other malignant neoplasm of skin: Secondary | ICD-10-CM | POA: Diagnosis not present

## 2020-11-07 DIAGNOSIS — L821 Other seborrheic keratosis: Secondary | ICD-10-CM | POA: Diagnosis not present

## 2020-11-27 DIAGNOSIS — E039 Hypothyroidism, unspecified: Secondary | ICD-10-CM | POA: Diagnosis not present

## 2020-11-27 DIAGNOSIS — E7800 Pure hypercholesterolemia, unspecified: Secondary | ICD-10-CM | POA: Diagnosis not present

## 2020-11-27 DIAGNOSIS — R7301 Impaired fasting glucose: Secondary | ICD-10-CM | POA: Diagnosis not present

## 2020-11-27 DIAGNOSIS — Z0000 Encounter for general adult medical examination without abnormal findings: Secondary | ICD-10-CM | POA: Diagnosis not present

## 2020-11-28 ENCOUNTER — Other Ambulatory Visit: Payer: Self-pay | Admitting: Internal Medicine

## 2020-11-28 DIAGNOSIS — R0989 Other specified symptoms and signs involving the circulatory and respiratory systems: Secondary | ICD-10-CM

## 2020-12-03 ENCOUNTER — Ambulatory Visit
Admission: RE | Admit: 2020-12-03 | Discharge: 2020-12-03 | Disposition: A | Discharge status: Home / Self Care | Payer: BC Managed Care – PPO | Source: Ambulatory Visit | Attending: Internal Medicine | Admitting: Internal Medicine

## 2020-12-03 DIAGNOSIS — R0989 Other specified symptoms and signs involving the circulatory and respiratory systems: Secondary | ICD-10-CM

## 2020-12-17 ENCOUNTER — Other Ambulatory Visit: Payer: BC Managed Care – PPO

## 2020-12-19 ENCOUNTER — Ambulatory Visit
Admission: RE | Admit: 2020-12-19 | Discharge: 2020-12-19 | Disposition: A | Discharge status: Home / Self Care | Payer: BC Managed Care – PPO | Source: Ambulatory Visit | Attending: Internal Medicine | Admitting: Internal Medicine

## 2020-12-19 DIAGNOSIS — R0989 Other specified symptoms and signs involving the circulatory and respiratory systems: Secondary | ICD-10-CM | POA: Diagnosis not present

## 2020-12-19 DIAGNOSIS — G458 Other transient cerebral ischemic attacks and related syndromes: Secondary | ICD-10-CM | POA: Diagnosis not present

## 2020-12-23 DIAGNOSIS — Z1211 Encounter for screening for malignant neoplasm of colon: Secondary | ICD-10-CM | POA: Diagnosis not present

## 2020-12-23 DIAGNOSIS — Z1212 Encounter for screening for malignant neoplasm of rectum: Secondary | ICD-10-CM | POA: Diagnosis not present

## 2021-01-20 DIAGNOSIS — R051 Acute cough: Secondary | ICD-10-CM | POA: Diagnosis not present

## 2021-01-20 DIAGNOSIS — Z03818 Encounter for observation for suspected exposure to other biological agents ruled out: Secondary | ICD-10-CM | POA: Diagnosis not present

## 2021-01-20 DIAGNOSIS — J3489 Other specified disorders of nose and nasal sinuses: Secondary | ICD-10-CM | POA: Diagnosis not present

## 2021-01-20 DIAGNOSIS — R509 Fever, unspecified: Secondary | ICD-10-CM | POA: Diagnosis not present

## 2021-02-18 DIAGNOSIS — R6883 Chills (without fever): Secondary | ICD-10-CM | POA: Diagnosis not present

## 2021-02-18 DIAGNOSIS — R0981 Nasal congestion: Secondary | ICD-10-CM | POA: Diagnosis not present

## 2021-02-18 DIAGNOSIS — R509 Fever, unspecified: Secondary | ICD-10-CM | POA: Diagnosis not present

## 2021-02-26 DIAGNOSIS — E7800 Pure hypercholesterolemia, unspecified: Secondary | ICD-10-CM | POA: Diagnosis not present

## 2021-02-27 DIAGNOSIS — R5383 Other fatigue: Secondary | ICD-10-CM | POA: Diagnosis not present

## 2021-03-03 DIAGNOSIS — K6389 Other specified diseases of intestine: Secondary | ICD-10-CM | POA: Diagnosis not present

## 2021-03-03 DIAGNOSIS — K635 Polyp of colon: Secondary | ICD-10-CM | POA: Diagnosis not present

## 2021-03-03 DIAGNOSIS — D122 Benign neoplasm of ascending colon: Secondary | ICD-10-CM | POA: Diagnosis not present

## 2021-03-03 DIAGNOSIS — Z1211 Encounter for screening for malignant neoplasm of colon: Secondary | ICD-10-CM | POA: Diagnosis not present

## 2021-03-03 DIAGNOSIS — D125 Benign neoplasm of sigmoid colon: Secondary | ICD-10-CM | POA: Diagnosis not present

## 2021-03-03 DIAGNOSIS — K5730 Diverticulosis of large intestine without perforation or abscess without bleeding: Secondary | ICD-10-CM | POA: Diagnosis not present

## 2021-04-22 DIAGNOSIS — J301 Allergic rhinitis due to pollen: Secondary | ICD-10-CM | POA: Diagnosis not present

## 2021-04-22 DIAGNOSIS — R0982 Postnasal drip: Secondary | ICD-10-CM | POA: Diagnosis not present

## 2021-04-23 ENCOUNTER — Other Ambulatory Visit: Payer: Self-pay | Admitting: Cardiology

## 2021-04-29 DIAGNOSIS — M79672 Pain in left foot: Secondary | ICD-10-CM | POA: Diagnosis not present

## 2021-05-27 DIAGNOSIS — L821 Other seborrheic keratosis: Secondary | ICD-10-CM | POA: Diagnosis not present

## 2021-07-20 DIAGNOSIS — Z1231 Encounter for screening mammogram for malignant neoplasm of breast: Secondary | ICD-10-CM | POA: Diagnosis not present

## 2021-07-20 DIAGNOSIS — Z01419 Encounter for gynecological examination (general) (routine) without abnormal findings: Secondary | ICD-10-CM | POA: Diagnosis not present

## 2021-10-01 DIAGNOSIS — H43393 Other vitreous opacities, bilateral: Secondary | ICD-10-CM | POA: Diagnosis not present

## 2021-11-17 DIAGNOSIS — L821 Other seborrheic keratosis: Secondary | ICD-10-CM | POA: Diagnosis not present

## 2021-11-17 DIAGNOSIS — D224 Melanocytic nevi of scalp and neck: Secondary | ICD-10-CM | POA: Diagnosis not present

## 2021-11-17 DIAGNOSIS — Z85828 Personal history of other malignant neoplasm of skin: Secondary | ICD-10-CM | POA: Diagnosis not present

## 2021-11-17 DIAGNOSIS — D485 Neoplasm of uncertain behavior of skin: Secondary | ICD-10-CM | POA: Diagnosis not present

## 2021-11-17 DIAGNOSIS — D692 Other nonthrombocytopenic purpura: Secondary | ICD-10-CM | POA: Diagnosis not present

## 2021-11-17 DIAGNOSIS — D225 Melanocytic nevi of trunk: Secondary | ICD-10-CM | POA: Diagnosis not present

## 2022-01-23 IMAGING — CT CT CARDIAC CORONARY ARTERY CALCIUM SCORE
3 series · 14 of 20 positions shown, 16 images · non-contrast
Comparison: None.

CLINICAL DATA: 61-year-old female with mildly elevated cholesterol.

EXAM:
CT CARDIAC CORONARY ARTERY CALCIUM SCORE
TECHNIQUE: Non-contrast imaging through the heart was performed using
prospective ECG gating. Image post processing was performed on an
independent workstation, allowing for quantitative analysis of the
heart and coronary arteries. Note that this exam targets the heart
and the chest was not imaged in its entirety.

[Series 2: calcium scoring 2.00 qr36 bestdiast 70% hrt calciu · axial · 0.31mm/px · z∈[+1534,+1610]mm · 4 of 64 slices shown]
[im 13/64  vessel]
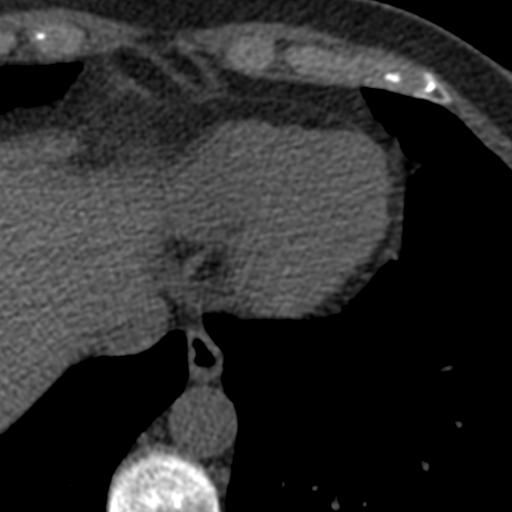
[im 26/64  vessel]
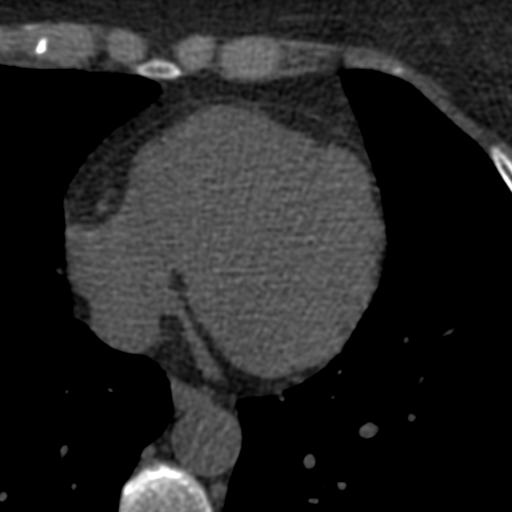
[im 38/64  vessel]
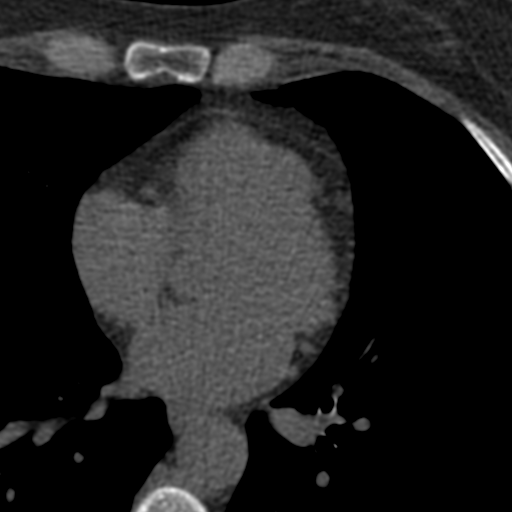
[im 51/64  vessel]
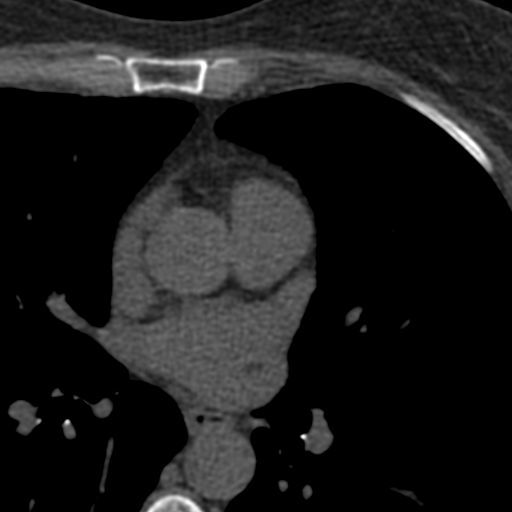

[Series 3: calcium scoring 2.00 br40 bestdiast 70% axial · axial · 0.51mm/px · z∈[+1530,+1614]mm · 5 of 64 slices shown, 7 images]
[im 11/64  vessel]
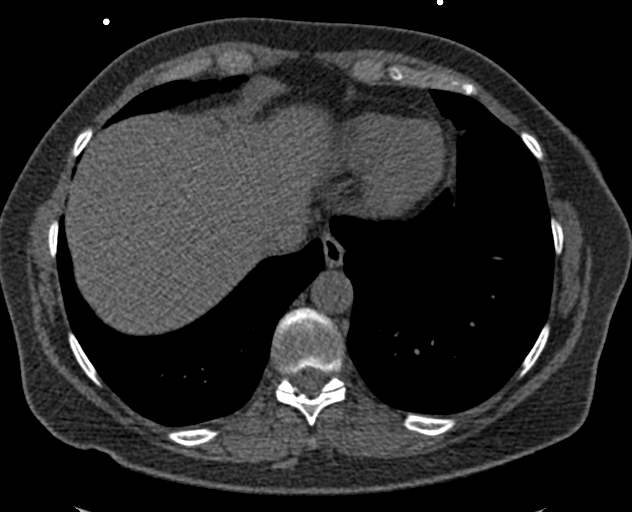
[im 11/64  lung]
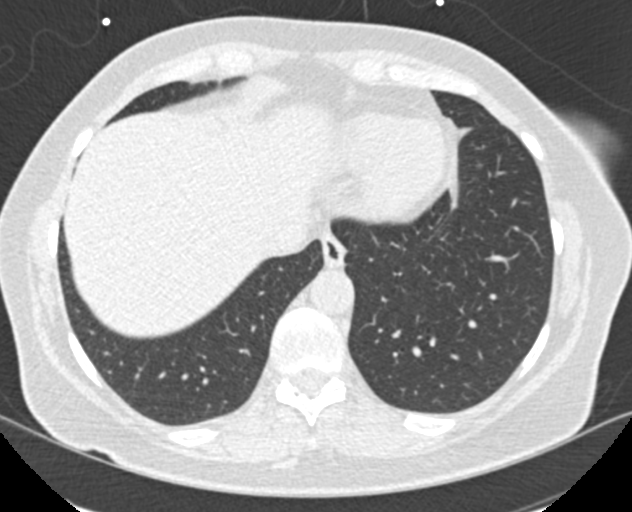
[im 22/64  vessel]
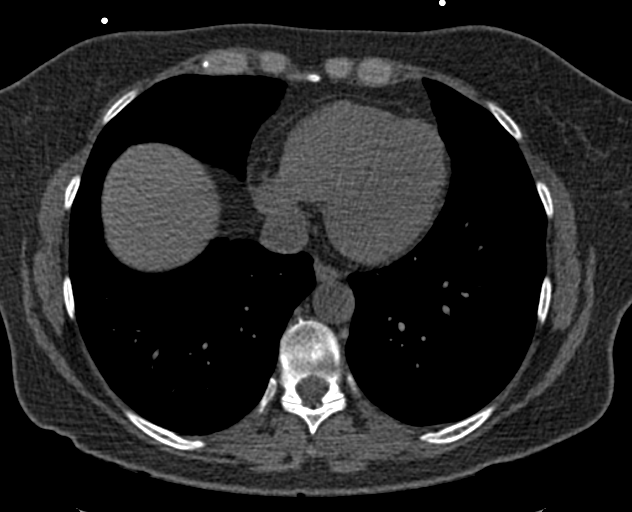
[im 32/64  vessel]
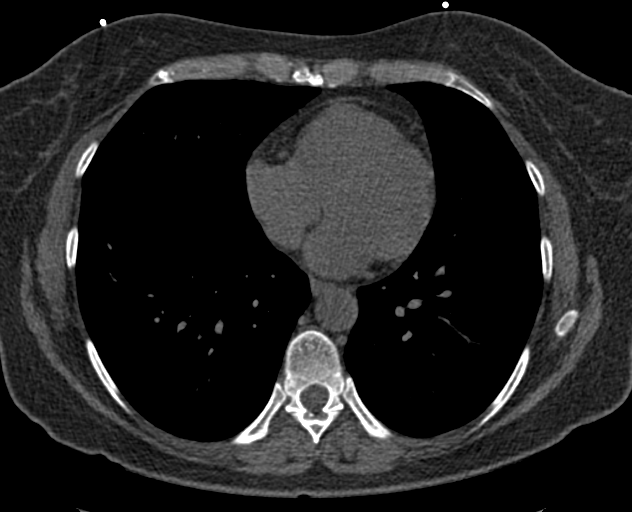
[im 43/64  vessel]
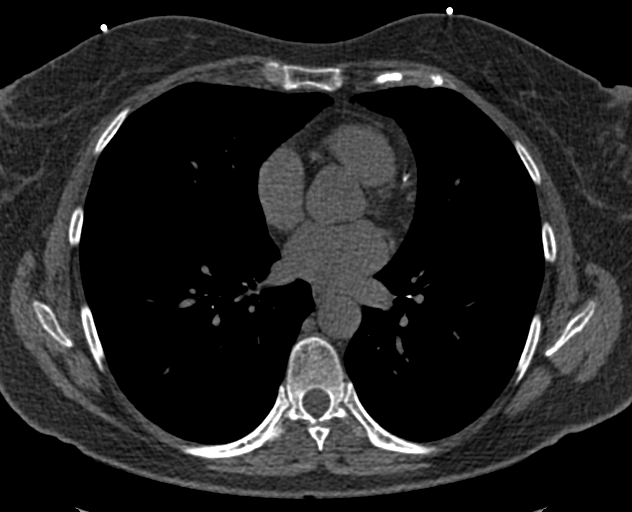
[im 53/64  vessel]
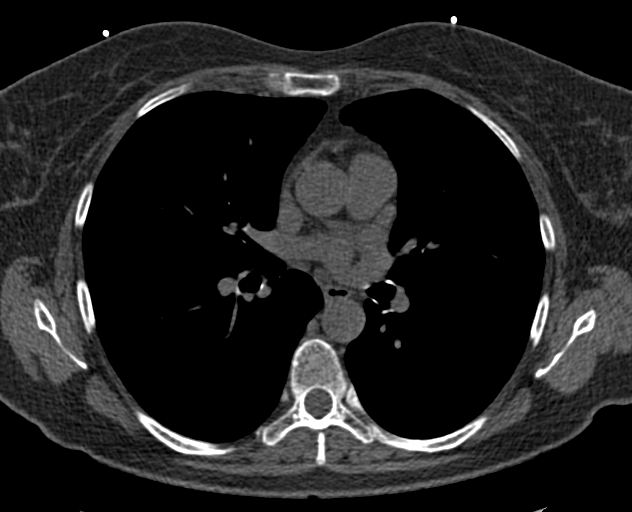
[im 53/64  lung]
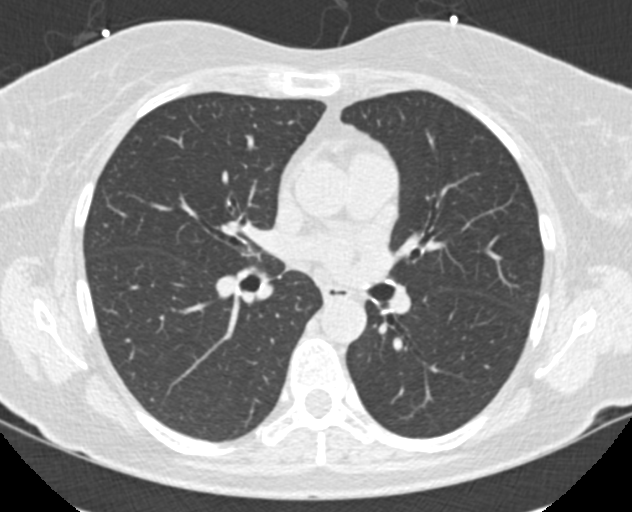

[Series 9: calcium scoring 2.00 br60 bestdiast 70% lungs · axial · 0.51mm/px · z∈[+1530,+1614]mm · 5 of 64 slices shown]
[im 11/64  vessel]
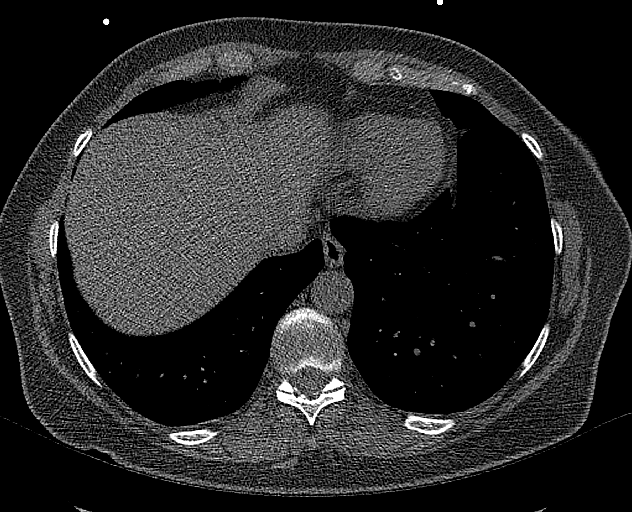
[im 22/64  vessel]
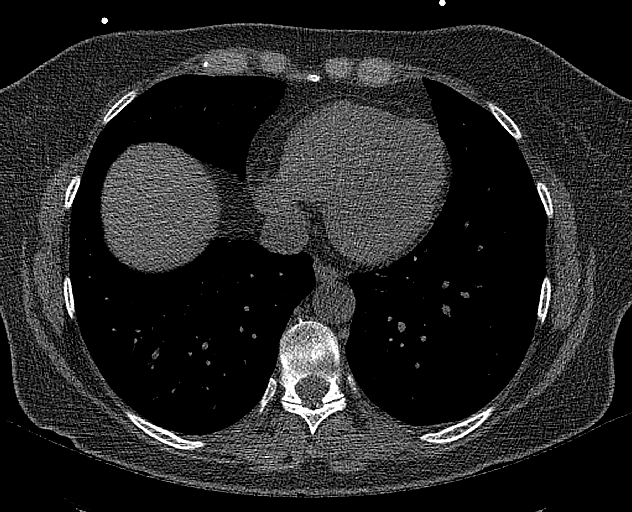
[im 32/64  vessel]
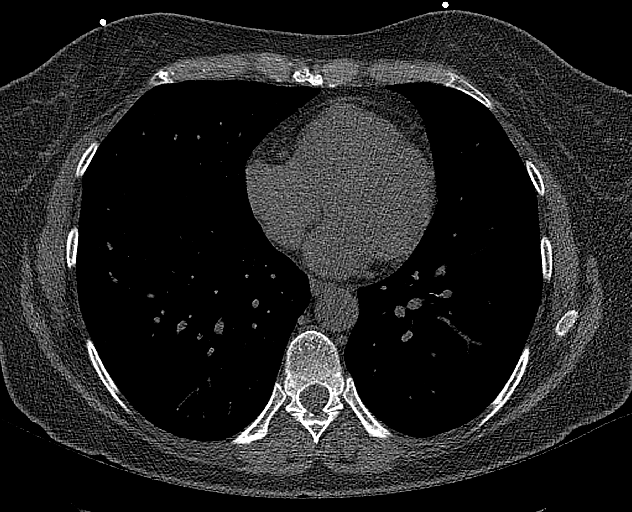
[im 43/64  vessel]
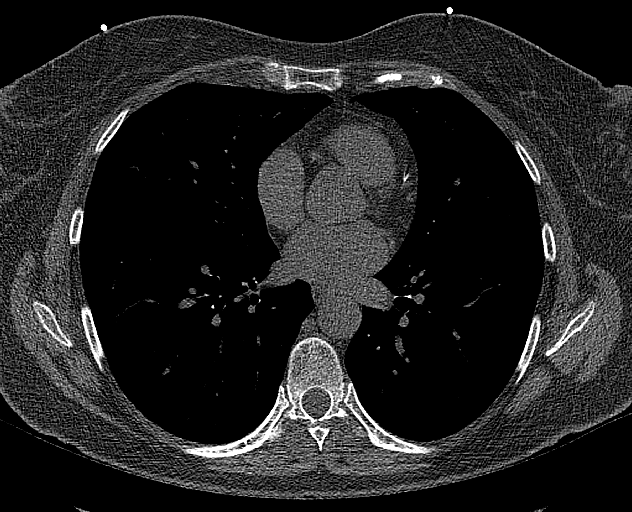
[im 53/64  vessel]
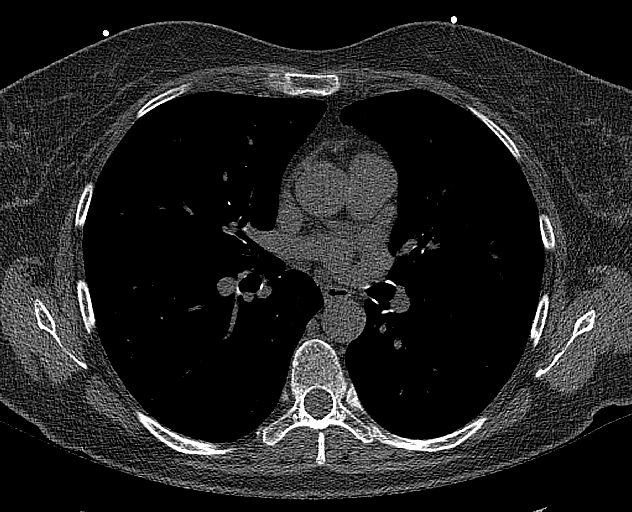

[14 of 20 positions shown; findings below may reference images not displayed]

FINDINGS: CORONARY CALCIUM SCORES:

Left Main:

LAD: 135

LCx: 0

RCA: 0

Total Agatston Score: 164

[HOSPITAL] percentile: 93

AORTA MEASUREMENTS:

Ascending Aorta: 27 mm

Descending Aorta: 21 mm

OTHER FINDINGS:

Cardiovascular: See above for coronary calcium scoring. Scattered
atherosclerotic changes with calcification along the ascending
aorta. Central pulmonary vasculature of normal caliber. Heart size
is normal without effusion. Limited assessment of cardiovascular
structures given lack of intravenous contrast.

Mediastinum/Nodes: Esophagus grossly normal within visualized
portions. No visible adenopathy in the mediastinum. Chest
incompletely imaged.

Lungs/Pleura: Lungs are clear. Airways are patent. No sign of
effusion or consolidation.

Upper Abdomen: Incidental imaging of upper abdominal contents
without signs of hepatic steatosis. No acute upper abdominal process
on very limited assessment.

Musculoskeletal: No acute bone finding. No destructive bone process.
IMPRESSION: 1. Coronary artery calcium score of 164. This places the patient in
the 93rd percentile for age, gender and race/ethnicity.
2. Aortic atherosclerosis.

Aortic Atherosclerosis ([id number]).

## 2022-02-25 ENCOUNTER — Encounter: Payer: Self-pay | Admitting: Physician Assistant

## 2022-02-25 NOTE — Progress Notes (Addendum)
Cardiology Office Note    Date:  02/26/2022   ID:  Emily Joyce, DOB Jan 02, 1961, MRN [id number]  PCP:  Lavone Orn, MD  Cardiologist:  Fransico Him, MD  Electrophysiologist:  None   Chief Complaint: f/u coronary calcification  History of Present Illness:   Emily Joyce is a 62 y.o. female with history of HLD, coronary calcification by CT, mild carotid disease with possible L subclavian artery stenosis by duplex 12/2020, pre-DM, subclinical hypothyroidism who is seen for follow-up. She was previously referred to our office for coronary calcification.   Per chart review, ETT 2019 was normal. Calcium score 02/2020 CAC 162 (93%ile), + aortic atherosclerosis. She has been managed with lipid therapy. In 07/2020 she had palpitations so monitor was ordered but not completed. Since last visit, PCP ordered carotid duplex 12/2020  with <50% bilaterally (mild), antegrade right vertebral flow, abnormal mixed antegrade and retrograde left vertebral artery flow with an elevated left subclavian velocity compatible with a central left subclavian arterial stenosis. (Subclavian steal phenomenon). Prior office note reflects h/o CVA but patient states there is no history of this - she is concerned at the last visit she may have been mixed up with someone else since she reports her medication list was filled with meds she was not on last visit. Presently she is not taking any medication as she stopped atorvastatin (10mg  daily) due to joint aches. She does recall primary care calling her about her [place] results after the last check but does not recall that any intervention was needed.  She reports about 3 weeks ago she started not feeling like herself with generalized fatigue, some low back pain, and also a fairly persistent dull chest aching. This started around the time she had been doing some lifting. The CP lasted a week and was not associated with any provoking or relieving factors, resolved on its own. She saw  a functional medicine provider who ran labs with normal Hgb, Cr, mildly elevated alk phos/GTT (has been elevated in the past), elevated TSH with normal free thyroxine, low vitamin D amongst other labs. LDL was 183 with triglycerides of 171. She has not had her f/u there to discuss treatment steps. In the meantime she reports that she has developed intermittent rectal bleeding in the last week. No melena, She saw primary care APP yesterday for these issues whom she states did a rectal exam without significant findings and referred her to her GYN since she has history of prolapse issues. She had a colonoscopy a year ago and states it was OK. She also had been eating beets recently and is not sure if this was contributing so she just stopped. She denies any symptoms of subclavian steal - no nausea, dizziness, syncope or symptoms when raising arms over head. BP R arm 122/80, L arm 124/82.  Patient shows me portal labs outlined below. I have also asked our nurse to reach out to [facility] to get a formal copy scanned in for our review.  Labwork independently reviewed: 02/25/22  Patient Portal on phone: Hgb 12.9, Plt 267, K 4.3, Cr 0.86, Tbili / alk phos mildly elevated, albumin 4.6, AST and ALT wnl 02/17/22 Patient portal from functional medicine doctor: Mg 2.1, TSH 5.510, Ft4 1.6, Vit D 26.6, Hgb 13.7, LDL 183, trig 171, HDL 57 KPN 02/2021 AST ALT AP ok, BUN 12 Cr 0.860, K 4.3, Na 142, Hgb 13.3, LDL 78, trig 261, 11/2020 TSH 4.1, A1C 5.7   Past History   Past  Medical History:  Diagnosis Date   Carotid arterial disease (HCC)    Coronary artery calcification    Female cystocele    Herpes zoster 2015   abdomen   Hypercholesteremia    Impaired fasting glucose    Prediabetes    Subclavian artery stenosis, left (HCC)    Subclinical hypothyroidism     Past Surgical History:  Procedure Laterality Date   BASAL CELL CARCINOMA EXCISION  2019   face   BREAST BIOPSY     left    Current Medications: No  outpatient medications have been marked as taking for the 02/26/22 encounter (Office Visit) with Laurann Montana, PA-C.      Allergies:   Patient has no known allergies.   Social History   Socioeconomic History   Marital status: Married    Spouse name: Not on file   Number of children: Not on file   Years of education: Not on file   Highest education level: Not on file  Occupational History   Not on file  Tobacco Use   Smoking status: Never   Smokeless tobacco: Never  Substance and Sexual Activity   Alcohol use: Yes   Drug use: No   Sexual activity: Not on file  Other Topics Concern   Not on file  Social History Narrative   Work or School: retired Publishing rights manager Situation: lives with husband      Spiritual Beliefs: Christian      Lifestyle: no regular exercise; diet is good            Social Determinants of Corporate investment banker Strain: Not on file  Food Insecurity: Not on file  Transportation Needs: Not on file  Physical Activity: Not on file  Stress: Not on file  Social Connections: Not on file     Family History:  The patient's family history includes CAD in her brother and mother; Cancer - Colon in her maternal grandmother; Dementia in her father; Diabetes in her father; High Cholesterol in her mother; Hypertension in her father; Hypothyroidism in her mother; Other in her brother.  ROS:   Please see the history of present illness. All other systems are reviewed and otherwise negative.    EKG(s)/Additional Testing   EKG:  EKG is ordered today, personally reviewed, demonstrating NSR 72bpm, normal, no acute STT changes  CV Studies: Cardiac studies reviewed are outlined and summarized above. Otherwise please see EMR for full report.  Recent Labs: No results found for requested labs within last 365 days.  Recent Lipid Panel    Component Value Date/Time   CHOL 247 (H) 03/23/2018 0948   TRIG 139.0 03/23/2018 0948   HDL 63.60 03/23/2018 0948    CHOLHDL 4 03/23/2018 0948   VLDL 27.8 03/23/2018 0948   LDLCALC 155 (H) 03/23/2018 0948   LDLDIRECT 159.7 03/03/2010 0849    PHYSICAL EXAM:    VS:  BP 124/80 (BP Location: Left Arm, Patient Position: Sitting, Cuff Size: Normal)   Pulse 72   Ht 5\' 5"  (1.651 m)   Wt 162 lb (73.5 kg)   SpO2 98%   BMI 26.96 kg/m   BMI: Body mass index is 26.96 kg/m.  GEN: Well nourished, well developed female in no acute distress HEENT: normocephalic, atraumatic Neck: no JVD, carotid bruits, or masses, no subclavian bruits Cardiac: RRR; very soft SEM difficult to localize, no murmurs, rubs, or gallops, no edema  Respiratory:  clear to auscultation bilaterally, normal work  of breathing GI: soft, nontender, nondistended, + BS MS: no deformity or atrophy Skin: warm and dry, no rash Neuro:  Alert and Oriented x 3, Strength and sensation are intact, follows commands Psych: euthymic mood, full affect  Wt Readings from Last 3 Encounters:  02/26/22 162 lb (73.5 kg)  04/22/20 153 lb (69.4 kg)  03/23/18 156 lb 6.4 oz (70.9 kg)     ASSESSMENT & PLAN:   1. Coronary artery calcification, HLD, precordial pain - mixed features for recent chest pain, mostly atypical, though with elevated calcium score on CT and uncontrolled HLD warranting further workup. Discussed proceeding with coronary CTA with usual pre-scanning metoprolol. She is agreeable. We discussed her uncontrolled HLD and goal LDL <70. Per our discussion will trial rosuvastatin 5mg  daily with f/u CMET/lipids in 6-8 weeks. If she fails rosuvastatin would refer to lipid clinic with pharmD to discuss alternatives. She reports she does not like to take medicines but understands the rationale behind treatment. We also discussed that CoQ 10 does not have good evidence behind it but anecdotally some patients have seemed to have tolerated statin when taking CoQ 10 in the form of 100mg  daily. Hold off ASA given rectal bleeding. She had labs at PCP yesterday, we  will request a formal copy but she shared the values copied above and Cr was normal.  2. Mild carotid disease - follow clinically, consider repeat in later 2024, can be reviewed at next visit.  3. Possible left subclavian artery stenosis - no symptoms to suggest subclavian steal; bilateral BPs are fairly equal. I will reach out to one of our PV physicians to determine their thoughts on any additional workup versus surveillance given asymptomatic nature.  4. Systolic murmur - very soft. Will arrange echocardiogram. She notes her father had a murmur.  She also describes nondescript fatigue, recent intermittent low back pain, and rectal bleeding recently. She saw primary care APP for this issue yesterday. She inquires about the etiology of her back pain. I have encouraged her to discuss further evaluation with her primary care team. I have also asked her to reach out to her gastroenterologist (the one who provided her colonoscopy) to discuss rectal bleeding and historically mildly abnormal LFTs including that obtained from her functional med provider. Dr. Radford Pax previously suggested that if she was felt to have subclinical hypothyroidism she may benefit from treatment as it may also impact her lipid levels. Await input from her functional med team - patient indicates she is awaiting call back from their team to discuss the labs they obtained on 1/17.     Disposition: Initially recommended f/u with me in 8 weeks but pt will be gone in March. I am not at [facility] most of April so will likely need f/u either another APP or Dr. Radford Pax.   Medication Adjustments/Labs and Tests Ordered: Current medicines are reviewed at length with the patient today.  Concerns regarding medicines are outlined above. Medication changes, Labs and Tests ordered today are summarized above and listed in the Patient Instructions accessible in Encounters.   Signed, Charlie Pitter, PA-C  02/26/2022 11:41 AM    [place] Phone: [phone]; Fax: [phone]

## 2022-02-26 ENCOUNTER — Ambulatory Visit: Payer: 59 | Attending: Physician Assistant | Admitting: Physician Assistant

## 2022-02-26 ENCOUNTER — Encounter: Payer: Self-pay | Admitting: Physician Assistant

## 2022-02-26 ENCOUNTER — Telehealth: Payer: Self-pay | Admitting: Physician Assistant

## 2022-02-26 VITALS — BP 124/80 | HR 72 | Ht 65.0 in | Wt 162.0 lb

## 2022-02-26 DIAGNOSIS — R011 Cardiac murmur, unspecified: Secondary | ICD-10-CM

## 2022-02-26 DIAGNOSIS — R072 Precordial pain: Secondary | ICD-10-CM | POA: Diagnosis not present

## 2022-02-26 DIAGNOSIS — I2584 Coronary atherosclerosis due to calcified coronary lesion: Secondary | ICD-10-CM

## 2022-02-26 DIAGNOSIS — I2510 Atherosclerotic heart disease of native coronary artery without angina pectoris: Secondary | ICD-10-CM | POA: Diagnosis not present

## 2022-02-26 DIAGNOSIS — E785 Hyperlipidemia, unspecified: Secondary | ICD-10-CM | POA: Diagnosis not present

## 2022-02-26 DIAGNOSIS — I771 Stricture of artery: Secondary | ICD-10-CM

## 2022-02-26 DIAGNOSIS — I779 Disorder of arteries and arterioles, unspecified: Secondary | ICD-10-CM

## 2022-02-26 MED ORDER — METOPROLOL TARTRATE 100 MG PO TABS: ORAL_TABLET | ORAL | 0 refills | Status: DC

## 2022-02-26 MED ORDER — ROSUVASTATIN CALCIUM 5 MG PO TABS: 5.0000 mg | ORAL_TABLET | Freq: Every day | ORAL | 3 refills | Status: AC

## 2022-02-26 NOTE — Telephone Encounter (Signed)
Left message for patient to return call.

## 2022-02-26 NOTE — Addendum Note (Signed)
Addended by: Juventino Slovak on: 02/26/2022 12:39 PM   Modules accepted: Orders

## 2022-02-26 NOTE — Telephone Encounter (Signed)
Pt returning call to CMA. Please advise 

## 2022-02-26 NOTE — Telephone Encounter (Signed)
Returned call to patient.  Per Melina Copa, PA-C: Please let pt know I did review her case with Dr. Gwenlyn Found, one of our doctors who does subclavian stenosis management. In the absence of symptoms he feels there is no acute intervention for the subclavian narrowing needed like we discussed but would suggest an annual carotid duplex. Since last carotid duplex was in 12/2020, would go ahead and get her set up for one next available (not emergent, just routine next available). Thank you!   Carotid duplex ordered. Scheduler to call patient and setup appointment. Patient verbalized understanding and expressed appreciation for call.

## 2022-02-26 NOTE — Patient Instructions (Addendum)
Medication Instructions:  Start rosuvastatin (Crestor) 5 mg daily *If you need a refill on your cardiac medications before your next appointment, please call your pharmacy*   Lab Work: Fasting lipids and CMET in 6-8 weeks If you have labs (blood work) drawn today and your tests are completely normal, you will receive your results only by: [place] (if you have MyChart) OR A paper copy in the mail If you have any lab test that is abnormal or we need to change your treatment, we will call you to review the results.   Testing/Procedures: Your physician has requested that you have an echocardiogram. Echocardiography is a painless test that uses sound waves to create images of your heart. It provides your doctor with information about the size and shape of your heart and how well your heart's chambers and valves are working. This procedure takes approximately one hour. There are no restrictions for this procedure. Please do NOT wear cologne, perfume, aftershave, or lotions (deodorant is allowed). Please arrive 15 minutes prior to your appointment time.     Your cardiac CT will be scheduled at one of the below locations:   [facility] [address] [place], [place] [address] (336) [place] [address] [place], [place] [address] [phone]  [facility] [place], [place] [address] [phone]  If scheduled at [facility], please arrive at the [facility] and Children's Entrance (Entrance C2) of [facility] 30 minutes prior to test start time. You can use the FREE valet parking offered at entrance C (encouraged to control the heart rate for the test)  Proceed to the [facility] Radiology Department (first floor) to check-in and test prep.  All radiology patients and guests should use entrance C2 at [facility], accessed from [facility], even though the hospital's physical address listed is [address]    If scheduled at [facility] or [facility], please arrive 15 mins early for check-in and test prep.   Please follow these instructions carefully (unless otherwise directed):  Hold all erectile dysfunction medications at least 3 days (72 hrs) prior to test. (Ie viagra, cialis, sildenafil, tadalafil, etc) We will administer nitroglycerin during this exam.   On the Night Before the Test: Be sure to Drink plenty of water. Do not consume any caffeinated/decaffeinated beverages or chocolate 12 hours prior to your test. Do not take any antihistamines 12 hours prior to your test. If the patient has contrast allergy: Patient will need a prescription for Prednisone and very clear instructions (as follows): Prednisone 50 mg - take 13 hours prior to test Take another Prednisone 50 mg 7 hours prior to test Take another Prednisone 50 mg 1 hour prior to test Take Benadryl 50 mg 1 hour prior to test Patient must complete all four doses of above prophylactic medications. Patient will need a ride after test due to Benadryl.  On the Day of the Test: Drink plenty of water until 1 hour prior to the test. Do not eat any food 1 hour prior to test. You may take your regular medications prior to the test.  Take metoprolol (Lopressor) 100 mg two hours prior to test. HOLD Furosemide/Hydrochlorothiazide morning of the test. FEMALES- please wear underwire-free bra if available, avoid dresses & tight clothing.       After the Test: Drink plenty of  water. After receiving IV contrast, you may experience a mild flushed feeling. This is normal. On occasion, you may experience a mild rash up to 24 hours after the test. This is not dangerous. If this occurs, you can take Benadryl 25 mg and increase your fluid intake. If you experience trouble breathing, this can be  serious. If it is severe call 911 IMMEDIATELY. If it is mild, please call our office. If you take any of these medications: Glipizide/Metformin, Avandament, Glucavance, please do not take 48 hours after completing test unless otherwise instructed.  We will call to schedule your test 2-4 weeks out understanding that some insurance companies will need an authorization prior to the service being performed.   For non-scheduling related questions, please contact the cardiac imaging nurse navigator should you have any questions/concerns: Marchia Bond, Cardiac Imaging Nurse Navigator Gordy Clement, Cardiac Imaging Nurse Navigator [facility] Direct Office Dial: [phone]   For scheduling needs, including cancellations and rescheduling, please call [place], [phone].   Follow-Up: At [facility], you and your health needs are our priority.  As part of our continuing mission to provide you with exceptional heart care, we have created designated Provider Care Teams.  These Care Teams include your primary Cardiologist (physician) and Advanced Practice Providers (APPs -  Physician Assistants and Nurse Practitioners) who all work together to provide you with the care you need, when you need it.  Your next appointment:   8 week(s)  Provider:   Melina Copa, PA-C       Other Instructions Please call your GI team today to discuss rectal bleeding and abnormal liver function test (alk-phos,ggt, bilirubin)

## 2022-02-26 NOTE — Telephone Encounter (Signed)
Hi Mindy - Please let pt know I did review her case with Dr. Gwenlyn Found, one of our doctors who does subclavian stenosis management. In the absence of symptoms he feels there is no acute intervention for the subclavian narrowing needed like we discussed but would suggest an annual carotid duplex. Since last carotid duplex was in 12/2020, would go ahead and get her set up for one next available (not emergent, just routine next available). Thank you!

## 2022-03-04 ENCOUNTER — Ambulatory Visit (HOSPITAL_COMMUNITY): Admission: RE | Admit: 2022-03-04 | Payer: 59 | Source: Ambulatory Visit

## 2022-03-08 ENCOUNTER — Ambulatory Visit (HOSPITAL_COMMUNITY)
Admission: RE | Admit: 2022-03-08 | Discharge: 2022-03-08 | Disposition: A | Discharge status: Home / Self Care | Payer: 59 | Source: Ambulatory Visit | Attending: Cardiology | Admitting: Cardiology

## 2022-03-08 DIAGNOSIS — I771 Stricture of artery: Secondary | ICD-10-CM

## 2022-03-22 ENCOUNTER — Encounter: Payer: Self-pay | Admitting: *Deleted

## 2022-03-26 ENCOUNTER — Telehealth: Payer: Self-pay | Admitting: Physician Assistant

## 2022-03-26 NOTE — Telephone Encounter (Signed)
Received msg below. Please call patient to inquire about concerns as I am out of the office today. I do not see any specific contraindications to contrast dye in her chart. Kidney function normal, no allergy. I do believe this is the best study for her to fully evaluate the extent of blockage noted given her high calcium score. We can consider a Lexiscan nuclear stress test understanding that this test is inferior to the coronary CTA with a potential false negative rate. Would still be a feasible alternative if she would prefer to pursue, but still involves medication and a type of IV radio-tracer to be infused with the test (different than IV contrast dye but helps to light up the heart in a different way). If she would prefer to defer altogether and await echo and discuss at f/u in 05/2022, let me know. Thank you!

## 2022-03-26 NOTE — Telephone Encounter (Signed)
Left message for return call.

## 2022-03-26 NOTE — Telephone Encounter (Signed)
-----   Message from Melony Overly sent at 03/26/2022  8:58 AM EST ----- Regarding: ct scan Good Morning,   I have contacted Mrs. Emily Joyce to schedule her Cta. She keeps refusing to schedule due to the contrast. She asked if someone can call her about the scan. She want to know if it is another test she can do without contrast instead.  Thanks, Emily Joyce

## 2022-04-02 NOTE — Telephone Encounter (Addendum)
Would relay the recommendation that coronary CT would be the superior test though we want to make sure she is comfortable with our care plan. We can consider a stress test but this would still require an IV with pharmacologic stress medicine and radiotracer. Thinking about the degree of calcification she has I would probably suggest a cardiac PET study over a nuclear stress test if she decides she wants to pursue this. Another alternative is a plain old exercise treadmill test but this gives [place] less information than the studies recommended above. I am willing to order this however if she does not want anything to do with the radiotracer above.  Please reply with what she decides to do. Will need attestation if she decides to pursue either cardiac PET or ETT. I will be out of the office starting this evening through next week and Isaac Laud will be covering. If stress test is chosen and not scheduled until a few weeks, I am happy to do the attestation when I get back if this msg is left in my inbox.  If she is uncomfortable with the idea of either, we can await echocardiogram to infer further decision making.

## 2022-04-02 NOTE — Telephone Encounter (Signed)
Left message for patient to callback.  Sent Melina Copa, PA-C's recommendation to patient via MyChart message.

## 2022-04-02 NOTE — Telephone Encounter (Signed)
Spoke with pt who states she "does not want to put dye in her body"  She would like to know if there is another type of testing equivalent   that could be ordered without dye. Pt advised will forward to Melina Copa, PA-C for review and recommendation.  Pt verbalizes understanding and agrees with current plan.  Pt verbalizes understanding and thanked Therapist, sports for the call.

## 2022-05-11 ENCOUNTER — Ambulatory Visit (HOSPITAL_COMMUNITY): Payer: 59 | Attending: Physician Assistant

## 2022-05-11 ENCOUNTER — Ambulatory Visit (HOSPITAL_COMMUNITY): Payer: 59

## 2022-05-12 NOTE — Progress Notes (Deleted)
Office Visit    Patient Name: Emily Joyce Date of Encounter: 05/12/2022  Primary Care Provider:  Kirby Funk, MD Primary Cardiologist:  Armanda Magic, MD Primary Electrophysiologist: None  Chief Complaint    Emily Joyce is a 62 y.o. female with PMH of CAD,  aortic atherosclerosis, mild carotid disease, HLD, possible stenosis of left subclavian artery who presents today for follow-up of coronary calcifications.  Past Medical History    Past Medical History:  Diagnosis Date   Carotid arterial disease (HCC)    Coronary artery calcification    Female cystocele    Herpes zoster 2015   abdomen   Hypercholesteremia    Impaired fasting glucose    Prediabetes    Subclavian artery stenosis, left (HCC)    Subclinical hypothyroidism    Past Surgical History:  Procedure Laterality Date   BASAL CELL CARCINOMA EXCISION  2019   face   BREAST BIOPSY     left    Allergies  No Known Allergies  History of Present Illness    Emily Joyce  is a 62 year old female with the above mention past medical history who presents today for follow-up of coronary calcifications.  She was initially seen by Dr. Mayford Knife and 04/2020 for evaluation of coronary artery calcifications.  She completed a coronary calcium score that was 164 which places her at 93 percentile.  She has a family history of coronary artery disease.  She also underwent a POET in 2009 that was normal.  She contacted the office in 2022 with complaint of fluttering and was ordered a ZIO monitor that was never applied.  She had a carotid duplex completed in 2022 by PCP that showed elevated subclavian velocities.  She underwent a carotid ultrasound that showed subclavian stenosis and results were reviewed with Dr. Gery Pray who plans to follow annually.  She was most recently seen by Ronie Spies, PA on 02/26/2022 for follow-up of coronary calcifications.  She noted an episode of chest pain that she described as a persistent dull chest  ache.  She was in agreement to proceed with coronary CTA however test was not completed due to fears of contrast dye.  Since last being seen in the office patient reports***.  Patient denies chest pain, palpitations, dyspnea, PND, orthopnea, nausea, vomiting, dizziness, syncope, edema, weight gain, or early satiety.  ***Notes: -We can plan for echo or POET if patient is still experiencing chest discomfort. Home Medications    Current Outpatient Medications  Medication Sig Dispense Refill   metoprolol tartrate (LOPRESSOR) 100 MG tablet Take one tablet by mouth 2 hours prior to CT. 1 tablet 0   rosuvastatin (CRESTOR) 5 MG tablet Take 1 tablet (5 mg total) by mouth daily. 90 tablet 3   No current facility-administered medications for this visit.     Review of Systems  Please see the history of present illness.    (+)*** (+)***  All other systems reviewed and are otherwise negative except as noted above.  Physical Exam    Wt Readings from Last 3 Encounters:  02/26/22 162 lb (73.5 kg)  04/22/20 153 lb (69.4 kg)  03/23/18 156 lb 6.4 oz (70.9 kg)   TG:PQDIY were no vitals filed for this visit.,There is no height or weight on file to calculate BMI.  Constitutional:      Appearance: Healthy appearance. Not in distress.  Neck:     Vascular: JVD normal.  Pulmonary:     Effort: Pulmonary effort is  normal.     Breath sounds: No wheezing. No rales. Diminished in the bases Cardiovascular:     Normal rate. Regular rhythm. Normal S1. Normal S2.      Murmurs: There is no murmur.  Edema:    Peripheral edema absent.  Abdominal:     Palpations: Abdomen is soft non tender. There is no hepatomegaly.  Skin:    General: Skin is warm and dry.  Neurological:     General: No focal deficit present.     Mental Status: Alert and oriented to person, place and time.     Cranial Nerves: Cranial nerves are intact.  EKG/LABS/ Recent Cardiac Studies    ECG personally reviewed by me today -  ***  Risk Assessment/Calculations:   {Does this patient have ATRIAL FIBRILLATION?:[phone]}        Lab Results  Component Value Date   WBC 5.8 03/23/2018   HGB 13.0 03/23/2018   HCT 38.7 03/23/2018   MCV 89.8 03/23/2018   PLT 245.0 03/23/2018   Lab Results  Component Value Date   CREATININE 0.86 11/01/2017   BUN 10 11/01/2017   NA 139 11/01/2017   K 3.8 11/01/2017   CL 105 11/01/2017   CO2 24 11/01/2017   Lab Results  Component Value Date   ALT 17 11/08/2016   AST 16 11/08/2016   ALKPHOS 115 11/08/2016   BILITOT 0.8 11/08/2016   Lab Results  Component Value Date   CHOL 247 (H) 03/23/2018   HDL 63.60 03/23/2018   LDLCALC 155 (H) 03/23/2018   LDLDIRECT 159.7 03/03/2010   TRIG 139.0 03/23/2018   CHOLHDL 4 03/23/2018    Lab Results  Component Value Date   HGBA1C 5.7 03/23/2018    Cardiac Studies & Procedures     STRESS TESTS  EXERCISE TOLERANCE TEST (ETT) 11/23/2017  Narrative  Blood pressure demonstrated a normal response to exercise.  There was no ST segment deviation noted during stress.  Normal exercise tolerance stress test with no ischemia,.  Normal exercise capacity reaching 10.1 mets at an adequate HR response of 99% MPHR. The patient was asymptomatic.  This is a low risk study.      CT SCANS  CT CARDIAC SCORING (SELF PAY ONLY) 02/07/2020  Narrative CLINICAL DATA:  62 year old female with mildly elevated cholesterol.  EXAM: CT CARDIAC CORONARY ARTERY CALCIUM SCORE  TECHNIQUE: Non-contrast imaging through the heart was performed using prospective ECG gating. Image post processing was performed on an independent workstation, allowing for quantitative analysis of the heart and coronary arteries. Note that this exam targets the heart and the chest was not imaged in its entirety.  COMPARISON:  None.  FINDINGS: CORONARY CALCIUM SCORES:  Left Main: 28.3  LAD: 135  LCx: 0  RCA: 0  Total Agatston Score: 164  MESA database  percentile: 93  AORTA MEASUREMENTS:  Ascending Aorta: 27 mm  Descending Aorta: 21 mm  OTHER FINDINGS:  Cardiovascular: See above for coronary calcium scoring. Scattered atherosclerotic changes with calcification along the ascending aorta. Central pulmonary vasculature of normal caliber. Heart size is normal without effusion. Limited assessment of cardiovascular structures given lack of intravenous contrast.  Mediastinum/Nodes: Esophagus grossly normal within visualized portions. No visible adenopathy in the mediastinum. Chest incompletely imaged.  Lungs/Pleura: Lungs are clear. Airways are patent. No sign of effusion or consolidation.  Upper Abdomen: Incidental imaging of upper abdominal contents without signs of hepatic steatosis. No acute upper abdominal process on very limited assessment.  Musculoskeletal: No acute bone finding. No  destructive bone process.  IMPRESSION: 1. Coronary artery calcium score of 164. This places the patient in the 93rd percentile for age, gender and race/ethnicity. 2. Aortic atherosclerosis.  Aortic Atherosclerosis (ICD10-I70.0).   Electronically Signed By: Donzetta Kohut M.D. On: 02/07/2020 08:55          Assessment & Plan    1.  Coronary artery calcifications: -Previous calcium score with known aortic atherosclerosis and score of 162 (93 percentile)  2.  Chest pain: -Patient reported chest pain at follow-up on 02/26/2022.  She was offered evaluation with cardiac CTA but declined due to contrast dye. -Today patient reports***  3.  Hyperlipidemia: -Patient's LDL cholesterol was***  4.  Essential hypertension: -Patient's blood pressure today was***      Disposition: Follow-up with Armanda Magic, MD or APP in *** months {Are you ordering a CV Procedure (e.g. stress test, cath, DCCV, TEE, etc)?   Press F2        :[id number]}   Medication Adjustments/Labs and Tests Ordered: Current medicines are reviewed at length with the patient  today.  Concerns regarding medicines are outlined above.   Signed, Napoleon Form, Leodis Rains, NP 05/12/2022, 1:59 PM [facility]  Note:  This document was prepared using Dragon voice recognition software and may include unintentional dictation errors.

## 2022-05-13 ENCOUNTER — Ambulatory Visit: Payer: 59 | Attending: Nurse Practitioner | Admitting: Nurse Practitioner

## 2022-05-13 DIAGNOSIS — E785 Hyperlipidemia, unspecified: Secondary | ICD-10-CM

## 2022-05-13 DIAGNOSIS — I10 Essential (primary) hypertension: Secondary | ICD-10-CM

## 2022-05-13 DIAGNOSIS — Z8679 Personal history of other diseases of the circulatory system: Secondary | ICD-10-CM

## 2022-05-13 DIAGNOSIS — R079 Chest pain, unspecified: Secondary | ICD-10-CM

## 2022-05-14 ENCOUNTER — Other Ambulatory Visit: Payer: Self-pay | Admitting: Obstetrics and Gynecology

## 2022-05-14 ENCOUNTER — Ambulatory Visit: Payer: 59 | Admitting: Obstetrics and Gynecology

## 2022-05-14 ENCOUNTER — Encounter: Payer: Self-pay | Admitting: Obstetrics and Gynecology

## 2022-05-14 VITALS — BP 105/65 | HR 70 | Ht 65.0 in | Wt 152.0 lb

## 2022-05-14 DIAGNOSIS — N812 Incomplete uterovaginal prolapse: Secondary | ICD-10-CM

## 2022-05-14 DIAGNOSIS — R350 Frequency of micturition: Secondary | ICD-10-CM | POA: Diagnosis not present

## 2022-05-14 DIAGNOSIS — N8110 Cystocele, unspecified: Secondary | ICD-10-CM

## 2022-05-14 DIAGNOSIS — N3281 Overactive bladder: Secondary | ICD-10-CM

## 2022-05-14 DIAGNOSIS — N816 Rectocele: Secondary | ICD-10-CM

## 2022-05-14 LAB — POCT URINALYSIS DIPSTICK
Bilirubin, UA: NEGATIVE
Glucose, UA: NEGATIVE
Ketones, UA: NEGATIVE
Nitrite, UA: NEGATIVE
Protein, UA: NEGATIVE
Spec Grav, UA: >=1.03 — AB (ref 1.010–1.025)
Urobilinogen, UA: 0.2 E.U./dL
pH, UA: 5.5 (ref 5.0–8.0)

## 2022-05-14 MED ORDER — MIRABEGRON ER 25 MG PO TB24: 25.0000 mg | ORAL_TABLET | Freq: Every day | ORAL | 5 refills | Status: DC

## 2022-05-14 NOTE — Patient Instructions (Signed)
You have a stage 4 (out of 4) prolapse.  We discussed the fact that it is not life threatening but there are several treatment options. For treatment of pelvic organ prolapse, we discussed options for management including expectant management, conservative management, and surgical management, such as Kegels, a pessary, pelvic floor physical therapy, and specific surgical procedures.    

## 2022-05-14 NOTE — Progress Notes (Addendum)
[facility] New Patient Evaluation and Consultation  Referring Provider: Delma Officer, PA PCP: Kirby Funk, MD Date of Service: 05/14/2022  SUBJECTIVE Chief Complaint: New Patient (Initial Visit) (Emily Joyce is a 62 y.o. female here for a consult for prolapse. Pt said she is interested in a pessary/)  History of Present Illness: Emily Joyce is a 62 y.o. White or Caucasian female seen in consultation at the request of PA Eliane Decree for evaluation of prolapse.    Review of records significant for: Has some uterine prolapse. Has to push it back inside in order to urinate.   Urinary Symptoms: Does not leak urine.   Day time voids 12-15.  Nocturia: 1-3 times per night to void. Voiding dysfunction: she does not empty her bladder well.  does not use a catheter to empty bladder.  When urinating, she feels a weak stream and to push on her belly or vagina to empty bladder Drinks: 1 cup coffee in AM, otherwise water Reports she has always voided a lot, even before the prolapse.   UTIs:  0  UTI's in the last year.   Denies history of blood in urine and kidney or bladder stones  Pelvic Organ Prolapse Symptoms:                   Admits to a feeling of a bulge the vaginal area. It has been present for 2 years.  She Admits to seeing a bulge.  This bulge is bothersome. Symptoms come and go, comes out more later in the evening or after playing pickleball. Overall is tolerable.  Has tried pelvic PT in the past.   Bowel Symptom: Bowel movements: 1 time(s) per day Stool consistency: soft  Straining: no.  Splinting: no.  Incomplete evacuation: no.  She Denies accidental bowel leakage / fecal incontinence Bowel regimen: none  Sexual Function Sexually active: yes.  Pain with sex: No  Pelvic Pain Denies pelvic pain  Past Medical History:  Past Medical History:  Diagnosis Date   Carotid arterial disease    Coronary artery calcification    Female  cystocele    Herpes zoster 2015   abdomen   Hypercholesteremia    Impaired fasting glucose    Prediabetes    Subclavian artery stenosis, left    Subclinical hypothyroidism      Past Surgical History:   Past Surgical History:  Procedure Laterality Date   BASAL CELL CARCINOMA EXCISION  2019   face   BREAST BIOPSY     left     Past OB/GYN History: OB History  Gravida Para Term Preterm AB Living  SAB IAB Ectopic Multiple Live Births          3    # Outcome Date GA Lbr Len/2nd Weight Sex Delivery Anes PTL Lv  3 Term      Vag-Spont     2 Term      Vag-Spont     1 Term      Vag-Spont      Menopausal: Denies vaginal bleeding since menopause Last pap smear was 2023- negative.     Medications: She has a current medication list which includes the following prescription(s): metoprolol tartrate, mirabegron er, and rosuvastatin.   Allergies: Patient has No Known Allergies.   Social History:  Social History   Tobacco Use   Smoking status: Never   Smokeless tobacco: Never  Vaping Use   Vaping  Use: Never used  Substance Use Topics   Alcohol use: Yes   Drug use: No    Relationship status: married She lives with her spouse.   She is not employed. Regular exercise: Yes:   History of abuse: No  Family History:   Family History  Problem Relation Age of Onset   Hyperlipidemia Mother    CAD Mother    Hypothyroidism Mother    Diabetes Father    Hypertension Father    Dementia Father    CAD Brother    Other Brother        helicopter crash   Cancer - Colon Maternal Grandmother      Review of Systems: Review of Systems  Constitutional:  Negative for fever, malaise/fatigue and weight loss.  Respiratory:  Negative for cough, shortness of breath and wheezing.   Cardiovascular:  Negative for chest pain, palpitations and leg swelling.  Gastrointestinal:  Negative for abdominal pain and blood in stool.  Genitourinary:  Negative for dysuria.   Musculoskeletal:  Negative for myalgias.  Skin:  Negative for rash.  Neurological:  Negative for dizziness and headaches.  Endo/Heme/Allergies:  Does not bruise/bleed easily.  Psychiatric/Behavioral:  Negative for depression. The patient is not nervous/anxious.      OBJECTIVE Physical Exam: Vitals:   05/14/22 1311  BP: 105/65  Pulse: 70  Weight: 152 lb (68.9 kg)  Height: 5\' 5"  (1.651 m)    Physical Exam Constitutional:      General: She is not in acute distress. Pulmonary:     Effort: Pulmonary effort is normal.  Abdominal:     General: There is no distension.     Palpations: Abdomen is soft.     Tenderness: There is no abdominal tenderness. There is no rebound.  Musculoskeletal:        General: No swelling. Normal range of motion.  Skin:    General: Skin is warm and dry.     Findings: No rash.  Neurological:     Mental Status: She is alert and oriented to person, place, and time.  Psychiatric:        Mood and Affect: Mood normal.        Behavior: Behavior normal.      GU / Detailed Urogynecologic Evaluation:  Pelvic Exam: Normal external female genitalia; Bartholin's and Skene's glands normal in appearance; urethral meatus normal in appearance, no urethral masses or discharge.   CST: negative  Complete prolapse noted. Normal vaginal mucosa with atrophy. Cervix normal appearance. Uterus normal single, nontender. Adnexa no mass, fullness, tenderness.     Pelvic floor strength I/V  Pelvic floor musculature: Right levator non-tender, Right obturator non-tender, Left levator non-tender, Left obturator non-tender  POP-Q:   POP-Q  3                                            Aa   5                                           Ba  5  C   7                                            Gh  3.5                                            Pb  6                                            tvl   3                                             Ap  5                                            Bp  -2                                              D      Rectal Exam:  Normal external rectum  Post-Void Residual (PVR) by Bladder Scan: In order to evaluate bladder emptying, we discussed obtaining a postvoid residual and she agreed to this procedure.  Procedure: The ultrasound unit was placed on the patient's abdomen in the suprapubic region after the patient had voided. A PVR of 30 ml was obtained by bladder scan.  Laboratory Results: POC urine: trace blood and leukocytes, otherwise negative   ASSESSMENT AND PLAN Ms. Fein is a 62 y.o. with:  1. Uterovaginal prolapse, incomplete   2. Prolapse of anterior vaginal wall   3. Prolapse of posterior vaginal wall   4. Overactive bladder   5. Urinary frequency    Stage IV anterior, Stage IV posterior, Stage IV apical prolapse - For treatment of pelvic organ prolapse, we discussed options for management including expectant management, conservative management, and surgical management, such as Kegels, a pessary, pelvic floor physical therapy, and specific surgical procedures. - We discussed two options for prolapse repair:  1) vaginal repair without mesh - Pros - safer, no mesh complications - Cons - not as strong as mesh repair, higher risk of recurrence  2) laparoscopic repair with mesh - Pros - stronger, better long-term success - Cons - risks of mesh implant (erosion into vagina or bladder, adhering to the rectum, pain) - these risks are lower than with a vaginal mesh but still exist - We discussed that due to advanced prolapse, sacrocolpopexy would be a better procedure for her.  - At this time, she would like to avoid surgery and start with a pessary. Will have her return for a fitting.   2. OAB - We discussed the symptoms of overactive bladder (OAB), which include urinary urgency, urinary frequency, nocturia, with or without urge incontinence.  While we  do not know the exact etiology of  OAB, several treatment options exist. We discussed management including behavioral therapy (decreasing bladder irritants, urge suppression strategies, timed voids, bladder retraining), physical therapy, medication; for refractory cases posterior tibial nerve stimulation, sacral neuromodulation, and intravesical botulinum toxin injection.  - Prescribed Myrbetriq 25mg  daily. For Beta-3 agonist medication, we discussed the potential side effect of elevated blood pressure which is more likely to occur in individuals with uncontrolled hypertension.   Return for pessary fitting   Marguerita Beards, MD

## 2022-05-17 MED ORDER — TROSPIUM CHLORIDE ER 60 MG PO CP24: 1.0000 | ORAL_CAPSULE | Freq: Every day | ORAL | 5 refills | Status: AC

## 2022-06-03 ENCOUNTER — Ambulatory Visit: Payer: 59 | Admitting: Obstetrics and Gynecology

## 2022-06-08 ENCOUNTER — Ambulatory Visit (HOSPITAL_COMMUNITY): Payer: 59 | Attending: Physician Assistant

## 2022-06-08 ENCOUNTER — Encounter (HOSPITAL_COMMUNITY): Payer: Self-pay | Admitting: Physician Assistant

## 2022-06-18 ENCOUNTER — Ambulatory Visit: Payer: 59 | Admitting: Obstetrics and Gynecology

## 2022-06-18 ENCOUNTER — Encounter: Payer: Self-pay | Admitting: Obstetrics and Gynecology

## 2022-06-18 VITALS — BP 104/69 | HR 79

## 2022-06-18 DIAGNOSIS — N812 Incomplete uterovaginal prolapse: Secondary | ICD-10-CM

## 2022-06-18 DIAGNOSIS — N8110 Cystocele, unspecified: Secondary | ICD-10-CM

## 2022-06-18 DIAGNOSIS — N816 Rectocele: Secondary | ICD-10-CM

## 2022-06-18 NOTE — Progress Notes (Signed)
[facility]   Subjective:     Chief Complaint: Pessary Fitting  History of Present Illness: Emily Joyce is a 62 y.o. female with stage IV pelvic organ prolapse and OAB who presents today for a pessary fitting.    Past Medical History: Patient  has a past medical history of Carotid arterial disease (HCC), Coronary artery calcification, Female cystocele, Herpes zoster (2015), Hypercholesteremia, Impaired fasting glucose, Prediabetes, Subclavian artery stenosis, left (HCC), and Subclinical hypothyroidism.   Past Surgical History: She  has a past surgical history that includes Breast biopsy and Excision basal cell carcinoma (2019).   Medications: She has a current medication list which includes the following prescription(s): rosuvastatin and trospium chloride.   Allergies: Patient has No Known Allergies.   Social History: Patient  reports that she has never smoked. She has never used smokeless tobacco. She reports current alcohol use. She reports that she does not use drugs.      Objective:    BP 104/69   Pulse 79  Gen: No apparent distress, A&O x 3. Pelvic Exam: Normal external female genitalia; Bartholin's and Skene's glands normal in appearance; urethral meatus normal in appearance, no urethral masses or discharge.   Attempted a size 4 donut which was expelled with walking Attempted a size 4 cube    A size #4 cube pessary was fitted. It was comfortable, stayed in place with valsalva and was an appropriate size on examination, with one finger fitting between the pessary and the vaginal walls. The patient demonstrated proper removal and replacement with the assistance of a mirror and staff present. Patient was able to urinate without pessary expulsion.     Assessment/Plan:    Assessment: Emily Joyce is a 62 y.o. with stage IV pelvic organ prolapse and OAB who presents for a pessary fitting. Plan: She was fitted with a #4 cube pessary. She will keep the  pessary in place until next visit. She will use lubricant or coconut oil for pessary insertion or removal.   Follow-up in 4 weeks for a pessary check or sooner as needed.  All questions were answered.    Selmer Dominion, NP

## 2022-07-12 ENCOUNTER — Ambulatory Visit: Payer: 59 | Admitting: Obstetrics and Gynecology
